# Patient Record
Sex: Male | Born: 1990 | Race: White | Hispanic: No | Marital: Single | Smoking: Former smoker
Health system: Southern US, Community
[De-identification: ages and names within clinical notes are randomized; demographics above are authoritative.]

## PROBLEM LIST (undated history)

## (undated) DIAGNOSIS — J40 Bronchitis, not specified as acute or chronic: Secondary | ICD-10-CM

## (undated) DIAGNOSIS — F419 Anxiety disorder, unspecified: Secondary | ICD-10-CM

## (undated) DIAGNOSIS — T7840XA Allergy, unspecified, initial encounter: Secondary | ICD-10-CM

## (undated) HISTORY — DX: Allergy, unspecified, initial encounter: T78.40XA

## (undated) HISTORY — DX: Anxiety disorder, unspecified: F41.9

---
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
## 2009-03-22 ENCOUNTER — Emergency Department (HOSPITAL_COMMUNITY): Admission: EM | Admit: 2009-03-22 | Discharge: 2009-03-22 | Payer: Self-pay | Admitting: Emergency Medicine

## 2009-11-15 ENCOUNTER — Emergency Department (HOSPITAL_COMMUNITY): Admission: EM | Admit: 2009-11-15 | Discharge: 2009-11-15 | Payer: Self-pay | Admitting: Family Medicine

## 2010-08-21 LAB — POCT I-STAT, CHEM 8
Calcium, Ion: 1.19 mmol/L (ref 1.12–1.32)
Chloride: 105 mEq/L (ref 96–112)
Creatinine, Ser: 1 mg/dL (ref 0.4–1.5)
Glucose, Bld: 91 mg/dL (ref 70–99)
HCT: 51 % (ref 39.0–52.0)
Hemoglobin: 17.3 g/dL — ABNORMAL HIGH (ref 13.0–17.0)
Potassium: 4.1 mEq/L (ref 3.5–5.1)

## 2010-10-04 ENCOUNTER — Emergency Department (HOSPITAL_COMMUNITY)
Admission: EM | Admit: 2010-10-04 | Discharge: 2010-10-04 | Disposition: A | Discharge status: Home / Self Care | Payer: Self-pay | Attending: Emergency Medicine | Admitting: Emergency Medicine

## 2010-10-04 DIAGNOSIS — R05 Cough: Secondary | ICD-10-CM | POA: Insufficient documentation

## 2010-10-04 DIAGNOSIS — R059 Cough, unspecified: Secondary | ICD-10-CM | POA: Insufficient documentation

## 2010-10-04 DIAGNOSIS — J069 Acute upper respiratory infection, unspecified: Secondary | ICD-10-CM | POA: Insufficient documentation

## 2012-03-11 ENCOUNTER — Emergency Department (HOSPITAL_COMMUNITY)
Admission: EM | Admit: 2012-03-11 | Discharge: 2012-03-11 | Disposition: A | Discharge status: Home / Self Care | Payer: Self-pay | Attending: Emergency Medicine | Admitting: Emergency Medicine

## 2012-03-11 ENCOUNTER — Emergency Department (HOSPITAL_COMMUNITY): Payer: Self-pay

## 2012-03-11 ENCOUNTER — Encounter (HOSPITAL_COMMUNITY): Payer: Self-pay | Admitting: Emergency Medicine

## 2012-03-11 DIAGNOSIS — Z87891 Personal history of nicotine dependence: Secondary | ICD-10-CM | POA: Insufficient documentation

## 2012-03-11 DIAGNOSIS — J40 Bronchitis, not specified as acute or chronic: Secondary | ICD-10-CM | POA: Insufficient documentation

## 2012-03-11 HISTORY — DX: Bronchitis, not specified as acute or chronic: J40

## 2012-03-11 MED ORDER — AZITHROMYCIN 250 MG PO TABS: 250.0000 mg | ORAL_TABLET | Freq: Every day | ORAL | Status: DC

## 2012-03-11 MED ORDER — DEXTROMETHORPHAN POLISTIREX 30 MG/5ML PO LQCR: 60.0000 mg | ORAL | Status: DC | PRN

## 2012-03-11 NOTE — ED Provider Notes (Signed)
History     CSN: [id number]  Arrival date & time 03/11/12  0806   First MD Initiated Contact with Patient 03/11/12 0913      Chief Complaint  Patient presents with  . Cough    (Consider location/radiation/quality/duration/timing/severity/associated sxs/prior treatment) HPI Comments: This is a 21 year old male who presents to the emergency department with chief complaint of cough x5 days. He states that the cough came on suddenly. He states that he had 2 episodes of fever of 102F. He states that he has tried Warden/ranger with some relief. He is not coughing up sputum. Denies chest pain, shortness of breath, nausea, vomiting, diarrhea, constipation. Endorses cough, sore throat, runny nose.  The history is provided by the patient. No language interpreter was used.    Past Medical History  Diagnosis Date  . Bronchitis     History reviewed. No pertinent past surgical history.  No family history on file.  History  Substance Use Topics  . Smoking status: Former Smoker    Types: Cigarettes    Quit date: 03/05/2012  . Smokeless tobacco: Never Used  . Alcohol Use: Yes     social      Review of Systems  Constitutional: Negative for fever.  HENT: Positive for rhinorrhea.   Eyes: Negative for visual disturbance.  Respiratory: Positive for cough. Negative for chest tightness and wheezing.   Cardiovascular: Negative for chest pain and palpitations.  Gastrointestinal: Negative for nausea, vomiting, abdominal pain and diarrhea.  Genitourinary: Negative for dysuria.  Musculoskeletal: Negative for back pain.  Skin: Negative for rash.  Neurological: Negative for weakness.  Psychiatric/Behavioral: Negative for agitation.  All other systems reviewed and are negative.    Allergies  Ceclor  Home Medications   Current Outpatient Rx  Name Route Sig Dispense Refill  . DEXTROMETHORPHAN HBR 15 MG/15ML PO LIQD Oral Take 15 mLs by mouth every 6 (six) hours as needed. Cold  symptoms    . DM-DOXYLAMINE-ACETAMINOPHEN 15-6.25-325 MG/15ML PO LIQD Oral Take 15 mLs by mouth every 6 (six) hours as needed. Sleep      BP 134/91  Pulse 61  Temp 97.7 F (36.5 C) (Oral)  Resp 20  SpO2 100%  Physical Exam  Nursing note and vitals reviewed. Constitutional: He is oriented to person, place, and time. He appears well-developed and well-nourished.  HENT:  Head: Normocephalic and atraumatic.  Eyes: Conjunctivae normal and EOM are normal. Pupils are equal, round, and reactive to light.  Neck: Normal range of motion. Neck supple.  Cardiovascular: Normal rate, regular rhythm and normal heart sounds.   Pulmonary/Chest: Effort normal and breath sounds normal.  Abdominal: Soft. Bowel sounds are normal. He exhibits no distension and no mass. There is no tenderness. There is no rebound and no guarding.  Musculoskeletal: Normal range of motion.  Neurological: He is alert and oriented to person, place, and time.  Skin: Skin is warm and dry.  Psychiatric: He has a normal mood and affect. His behavior is normal. Judgment and thought content normal.    ED Course  Procedures (including critical care time)  Labs Reviewed - No data to display Dg Chest 2 View  03/11/2012  *RADIOLOGY REPORT*  Clinical Data: Deep cough for 3 days.  Smoker.  CHEST - 2 VIEW  Comparison: None.  Findings: No infiltrate, congestive heart failure or pneumothorax.  Heart size within normal limits.  IMPRESSION: No acute abnormality.   Original Report Authenticated By: Fuller Canada, M.D.      1.  Bronchitis       MDM  21 year old male with cough. Chest x-ray reveals no acute process. No signs of pneumonia. Patient is afebrile here in the ED. Will discharge the patient with Delsym cough syrup and Z-Pak. Instructed the patient to hold off on filling the a Z-Pak for 2 days. Continue supportive care with fluids, rest, OTC medications. The patient is stable for discharge.        Roxy Horseman,  PA-C 03/11/12 [phone]

## 2012-03-11 NOTE — ED Notes (Signed)
Pt states that he has been having cough and congestion since Saturday. Taking Dayquil and Nyquil but not helping. States his cough is dry, has been having intermittent fevers.  States chest hurts when he coughs

## 2012-03-12 NOTE — ED Provider Notes (Signed)
Medical screening examination/treatment/procedure(s) were performed by non-physician practitioner and as supervising physician I was immediately available for consultation/collaboration.  Jones Skene, M.D.     Jones Skene, MD 03/12/12 1351

## 2012-10-09 ENCOUNTER — Ambulatory Visit: Payer: 59 | Admitting: Family Medicine

## 2012-10-09 VITALS — BP 122/68 | HR 70 | Temp 98.5°F | Resp 16 | Ht 72.0 in | Wt 242.8 lb

## 2012-10-09 DIAGNOSIS — J039 Acute tonsillitis, unspecified: Secondary | ICD-10-CM

## 2012-10-09 DIAGNOSIS — J029 Acute pharyngitis, unspecified: Secondary | ICD-10-CM

## 2012-10-09 MED ORDER — AMOXICILLIN 400 MG/5ML PO SUSR: ORAL | Status: DC

## 2012-10-09 NOTE — Progress Notes (Signed)
[address]   [place], [place]  [address]   [phone]  Subjective:    Patient ID: Ernest Jennings, male    DOB: 1991-04-17, 22 y.o.   MRN: [id number]  HPI This 22 y.o. male presents for evaluation of sore throat.  Onset two nights ago.  No fever/body aches.  +Mild headache today; +fatigue +dizziness.  No chills/sweats.  +ST diffuse B; no pain with talking or breathing; severe pain with swallowing; no drooling or spitting but a lot of drainage.  No rhinorrhea; no nasal congestion; +PND.  No cough.  No nausea, vomiting,diarrhea; + stomach pain after lunch.  Mild rash on lower abdomen.  No medications.  No tick bites in past three weeks.   PCP:  none    Review of Systems  Constitutional: Positive for fatigue. Negative for fever, chills and diaphoresis.  HENT: Positive for sore throat and trouble swallowing. Negative for ear pain, congestion, rhinorrhea, sneezing, mouth sores, voice change and postnasal drip.   Respiratory: Negative for cough and shortness of breath.   Gastrointestinal: Negative for nausea, vomiting and diarrhea.  Skin: Negative for rash.  Neurological: Positive for dizziness, light-headedness and headaches.        Past Medical History  Diagnosis Date  . Bronchitis   . Allergy     History reviewed. No pertinent past surgical history.  Prior to Admission medications   Not on File    Allergies  Allergen Reactions  . Ceclor (Cefaclor) Hives    History   Social History  . Marital Status: Single    Spouse Name: N/A    Number of Children: N/A  . Years of Education: N/A   Occupational History  . Not on file.   Social History Main Topics  . Smoking status: Former Smoker    Types: Cigarettes    Quit date: 03/05/2012  . Smokeless tobacco: Never Used  . Alcohol Use: Yes     Comment: social  . Drug Use: No  . Sexually Active: Yes    Birth Control/ Protection: Condom   Other Topics Concern  . Not on file   Social History Narrative  . No narrative  on file    Family History  Problem Relation Age of Onset  . Cancer Maternal Grandmother   . Heart disease Maternal Grandfather     Objective:   Physical Exam  Nursing note and vitals reviewed. Constitutional: He is oriented to person, place, and time. He appears well-developed and well-nourished. No distress.  HENT:  Head: Normocephalic and atraumatic.  Right Ear: External ear normal.  Left Ear: External ear normal.  Mouth/Throat: Mucous membranes are normal. Oropharyngeal exudate, posterior oropharyngeal edema and posterior oropharyngeal erythema present. No tonsillar abscesses.  B tonsillar hypertrophy; no uvula deviation.  Eyes: Conjunctivae are normal. Pupils are equal, round, and reactive to light.  Neck: Normal range of motion. Neck supple.  Cardiovascular: Normal rate, regular rhythm and normal heart sounds.   Pulmonary/Chest: Effort normal and breath sounds normal. He has no wheezes. He has no rales.  Lymphadenopathy:    He has cervical adenopathy.  Neurological: He is alert and oriented to person, place, and time.  Skin: No rash noted. He is not diaphoretic.  Psychiatric: He has a normal mood and affect. His behavior is normal.    Results for orders placed in visit on 10/09/12  POCT RAPID STREP A (OFFICE)      Result Value Range   Rapid Strep A Screen Negative  Negative  Assessment & Plan:  Sore throat - Plan: POCT rapid strep A  Acute tonsillitis   1.  Tonsillitis/pharyngitis:  New.  Rapid strep negative.  Empirically treat with Amoxicillin tid x 10 days.  Recommend supportive care with Ibuprofen or Tylenol, gargles.  RTC inability to swallow. Also discussed possibility of Mono diagnosis; RTC one week if no improvement to test for mono.    Meds ordered this encounter  Medications  . amoxicillin (AMOXIL) 400 MG/5ML suspension    Sig: 7.16ml po tid x 10 days    Dispense:  230 mL    Refill:  0

## 2012-10-09 NOTE — Patient Instructions (Addendum)
1.  TAKE IBUPROFEN OR TYLENOL AS NEEDED FOR SORE THROAT, SWELLING, FEVER. 2. GARGLE WITH WARM SALT WATER AS NEEDED. 3. RETURN FOR INABILITY TO SWALLOW. 4. RETURN IF PERSISTENT SORE THROAT IN ONE WEEK TO BE TESTED FOR MONO.

## 2012-11-05 ENCOUNTER — Ambulatory Visit: Payer: 59 | Admitting: Emergency Medicine

## 2012-11-05 VITALS — BP 140/72 | HR 100 | Temp 97.7°F | Resp 16 | Ht 72.0 in | Wt 237.0 lb

## 2012-11-05 DIAGNOSIS — J039 Acute tonsillitis, unspecified: Secondary | ICD-10-CM

## 2012-11-05 MED ORDER — PENICILLIN V POTASSIUM 500 MG PO TABS: 500.0000 mg | ORAL_TABLET | Freq: Four times a day (QID) | ORAL | Status: DC

## 2012-11-05 NOTE — Patient Instructions (Addendum)

## 2012-11-05 NOTE — Progress Notes (Signed)
[facility] [address] [place] [address] [phone]  Date:  11/05/2012   Name:  JIMMIE RUETER   DOB:  Aug 06, 1990   MRN:  [id number]  PCP:  No PCP Per Patient    Chief Complaint: Sore Throat   History of Present Illness:  AMOUS CREWE is a 22 y.o. very pleasant male patient who presents with the following:  Seen on 5/8 with tonsillitis.  Treated with amoxicillin.  Completed course of treatment and was asymptomatic.  Now noticed that his tonsils were red, swollen and has an exudate.   No fever or chills. No coryza or cough.  No nausea or vomiting.  No rash or stool change.  No improvement with over the counter medications or other home remedies. Denies other complaint or health concern today.   There are no active problems to display for this patient.   Past Medical History  Diagnosis Date  . Bronchitis   . Allergy     No past surgical history on file.  History  Substance Use Topics  . Smoking status: Former Smoker    Types: Cigarettes    Quit date: 03/05/2012  . Smokeless tobacco: Never Used  . Alcohol Use: Yes     Comment: social    Family History  Problem Relation Age of Onset  . Cancer Maternal Grandmother   . Heart disease Maternal Grandfather     Allergies  Allergen Reactions  . Ceclor (Cefaclor) Hives    Medication list has been reviewed and updated.  Current Outpatient Prescriptions on File Prior to Visit  Medication Sig Dispense Refill  . amoxicillin (AMOXIL) 400 MG/5ML suspension 7.25ml po tid x 10 days  230 mL  0   No current facility-administered medications on file prior to visit.    Review of Systems:  As per HPI, otherwise negative.    Physical Examination: Filed Vitals:   11/05/12 1724  BP: 140/72  Pulse: 100  Temp: 97.7 F (36.5 C)  Resp: 16   Filed Vitals:   11/05/12 1724  Height: 6' (1.829 m)  Weight: 237 lb (107.502 kg)   Body mass index is 32.14 kg/(m^2). Ideal Body Weight: Weight in (lb)  to have BMI = 25: 183.9  GEN: WDWN, NAD, Non-toxic, A & O x 3 HEENT: Atraumatic, Normocephalic. Neck supple. No masses, No LAD.  Tonsils enlarged with exudate. Ears and Nose: No external deformity. CV: RRR, No M/G/R. No JVD. No thrill. No extra heart sounds. PULM: CTA B, no wheezes, crackles, rhonchi. No retractions. No resp. distress. No accessory muscle use. ABD: S, NT, ND, +BS. No rebound. No HSM. EXTR: No c/c/e NEURO Normal gait.  PSYCH: Normally interactive. Conversant. Not depressed or anxious appearing.  Calm demeanor.    Assessment and Plan: Tonsillitis Penicillin vk Follow up as needed   Signed,  Phillips Odor, MD

## 2013-07-09 ENCOUNTER — Other Ambulatory Visit: Payer: Self-pay | Admitting: Occupational Medicine

## 2013-07-09 ENCOUNTER — Ambulatory Visit: Payer: Self-pay

## 2013-07-09 DIAGNOSIS — M25512 Pain in left shoulder: Secondary | ICD-10-CM

## 2015-08-24 ENCOUNTER — Encounter (HOSPITAL_COMMUNITY): Payer: Self-pay | Admitting: Emergency Medicine

## 2015-08-24 ENCOUNTER — Emergency Department (HOSPITAL_COMMUNITY)
Admission: EM | Admit: 2015-08-24 | Discharge: 2015-08-24 | Disposition: A | Discharge status: Home / Self Care | Payer: Self-pay | Attending: Emergency Medicine | Admitting: Emergency Medicine

## 2015-08-24 DIAGNOSIS — R112 Nausea with vomiting, unspecified: Secondary | ICD-10-CM

## 2015-08-24 DIAGNOSIS — B349 Viral infection, unspecified: Secondary | ICD-10-CM | POA: Insufficient documentation

## 2015-08-24 DIAGNOSIS — Z792 Long term (current) use of antibiotics: Secondary | ICD-10-CM | POA: Insufficient documentation

## 2015-08-24 DIAGNOSIS — R197 Diarrhea, unspecified: Secondary | ICD-10-CM

## 2015-08-24 DIAGNOSIS — Z87891 Personal history of nicotine dependence: Secondary | ICD-10-CM | POA: Insufficient documentation

## 2015-08-24 LAB — COMPREHENSIVE METABOLIC PANEL
ALBUMIN: 4.5 g/dL (ref 3.5–5.0)
ALT: 28 U/L (ref 17–63)
ANION GAP: 8 (ref 5–15)
AST: 21 U/L (ref 15–41)
Alkaline Phosphatase: 77 U/L (ref 38–126)
BILIRUBIN TOTAL: 0.4 mg/dL (ref 0.3–1.2)
BUN: 12 mg/dL (ref 6–20)
CHLORIDE: 105 mmol/L (ref 101–111)
CO2: 26 mmol/L (ref 22–32)
Calcium: 9.5 mg/dL (ref 8.9–10.3)
Creatinine, Ser: 0.99 mg/dL (ref 0.61–1.24)
Glucose, Bld: 99 mg/dL (ref 65–99)
POTASSIUM: 4.7 mmol/L (ref 3.5–5.1)
Sodium: 139 mmol/L (ref 135–145)
TOTAL PROTEIN: 7.8 g/dL (ref 6.5–8.1)

## 2015-08-24 LAB — URINALYSIS, ROUTINE W REFLEX MICROSCOPIC
Bilirubin Urine: NEGATIVE
GLUCOSE, UA: NEGATIVE mg/dL
Hgb urine dipstick: NEGATIVE
KETONES UR: NEGATIVE mg/dL
LEUKOCYTES UA: NEGATIVE
NITRITE: NEGATIVE
PH: 6 (ref 5.0–8.0)
PROTEIN: NEGATIVE mg/dL
Specific Gravity, Urine: 1.022 (ref 1.005–1.030)

## 2015-08-24 LAB — CBC
HEMATOCRIT: 48.2 % (ref 39.0–52.0)
Hemoglobin: 16.2 g/dL (ref 13.0–17.0)
MCH: 30.3 pg (ref 26.0–34.0)
MCHC: 33.6 g/dL (ref 30.0–36.0)
MCV: 90.3 fL (ref 78.0–100.0)
PLATELETS: 321 10*3/uL (ref 150–400)
RBC: 5.34 MIL/uL (ref 4.22–5.81)
RDW: 12.2 % (ref 11.5–15.5)
WBC: 9.2 10*3/uL (ref 4.0–10.5)

## 2015-08-24 LAB — LIPASE, BLOOD: Lipase: 35 U/L (ref 11–51)

## 2015-08-24 MED ORDER — KETOROLAC TROMETHAMINE 30 MG/ML IJ SOLN: 30.0000 mg | Freq: Once | INTRAMUSCULAR | Status: DC

## 2015-08-24 MED ORDER — ONDANSETRON 8 MG PO TBDP
8.0000 mg | ORAL_TABLET | Freq: Once | ORAL | Status: AC
  Administered 2015-08-24: 8 mg via ORAL
  Filled 2015-08-24: qty 1

## 2015-08-24 MED ORDER — SODIUM CHLORIDE 0.9 % IV BOLUS (SEPSIS): 1000.0000 mL | Freq: Once | INTRAVENOUS | Status: DC

## 2015-08-24 MED ORDER — ONDANSETRON HCL 4 MG/2ML IJ SOLN: 4.0000 mg | Freq: Once | INTRAMUSCULAR | Status: DC

## 2015-08-24 MED ORDER — ONDANSETRON HCL 4 MG PO TABS: 4.0000 mg | ORAL_TABLET | Freq: Three times a day (TID) | ORAL | Status: DC | PRN

## 2015-08-24 MED ORDER — IBUPROFEN 800 MG PO TABS: 800.0000 mg | ORAL_TABLET | Freq: Three times a day (TID) | ORAL | Status: DC | PRN

## 2015-08-24 MED ORDER — KETOROLAC TROMETHAMINE 60 MG/2ML IM SOLN
60.0000 mg | Freq: Once | INTRAMUSCULAR | Status: AC
  Administered 2015-08-24: 60 mg via INTRAMUSCULAR
  Filled 2015-08-24: qty 2

## 2015-08-24 MED ORDER — LOPERAMIDE HCL 2 MG PO CAPS: 2.0000 mg | ORAL_CAPSULE | Freq: Four times a day (QID) | ORAL | Status: DC | PRN

## 2015-08-24 NOTE — ED Provider Notes (Signed)
CSN: [id number]     Arrival date & time 08/24/15  1006 History   First MD Initiated Contact with Patient 08/24/15 1156     Chief Complaint  Patient presents with  . N/V/D      (Consider location/radiation/quality/duration/timing/severity/associated sxs/prior Treatment) The history is provided by the patient and a significant other.     Pt presents with N/V/D, cough, sore throat x 3 days.  Denies fever, chills, CP, SOB, abdominal pain, urinary symptoms.  No blood in the emesis or diarrhea.  Has had sick contacts.  No medications prior to arrival.  He attempted to go to work today and felt lightheaded, decided to get checked.    Past Medical History  Diagnosis Date  . Bronchitis   . Allergy    History reviewed. No pertinent past surgical history. Family History  Problem Relation Age of Onset  . Cancer Maternal Grandmother   . Heart disease Maternal Grandfather    Social History  Substance Use Topics  . Smoking status: Former Smoker    Types: Cigarettes    Quit date: 03/05/2012  . Smokeless tobacco: Never Used  . Alcohol Use: Yes     Comment: social    Review of Systems  All other systems reviewed and are negative.     Allergies  Ceclor  Home Medications   Prior to Admission medications   Medication Sig Start Date End Date Taking? Authorizing Provider  amoxicillin (AMOXIL) 400 MG/5ML suspension 7.22ml po tid x 10 days 10/09/12   Ethelda Chick, MD  penicillin v potassium (VEETID) 500 MG tablet Take 1 tablet (500 mg total) by mouth 4 (four) times daily. 11/05/12   Carmelina Dane, MD   BP 119/78 mmHg  Pulse 61  Temp(Src) 98.4 F (36.9 C) (Oral)  Resp 14  SpO2 100% Physical Exam  Constitutional: He appears well-developed and well-nourished. No distress.  HENT:  Head: Normocephalic and atraumatic.  Mouth/Throat: Uvula is midline. Posterior oropharyngeal erythema present. No oropharyngeal exudate, posterior oropharyngeal edema or tonsillar abscesses.  Eyes:  Conjunctivae are normal.  Neck: Neck supple.  Cardiovascular: Normal rate and regular rhythm.   Pulmonary/Chest: Effort normal and breath sounds normal. No respiratory distress. He has no wheezes. He has no rales.  Abdominal: Soft. He exhibits no distension and no mass. There is tenderness (mild, diffuse ). There is no rebound and no guarding.  Neurological: He is alert. He exhibits normal muscle tone.  Skin: He is not diaphoretic.  Psychiatric: He has a normal mood and affect. His behavior is normal.  Nursing note and vitals reviewed.   ED Course  Procedures (including critical care time) Labs Review Labs Reviewed  LIPASE, BLOOD  COMPREHENSIVE METABOLIC PANEL  CBC  URINALYSIS, ROUTINE W REFLEX MICROSCOPIC (NOT AT [facility])    Imaging Review No results found. I have personally reviewed and evaluated these images and lab results as part of my medical decision-making.   EKG Interpretation None      MDM   Final diagnoses:  Viral syndrome  Nausea vomiting and diarrhea    Afebrile, nontoxic patient with constellation of symptoms suggestive of viral syndrome.  No concerning findings on exam.  Labs, UA unremarkable.  Pt tolerating PO fluid.  Discharged home with supportive care, PCP follow up.  Discussed result, findings, treatment, and follow up  with patient.  Pt given return precautions.  Pt verbalizes understanding and agrees with plan.        Trixie Dredge, PA-C 08/24/15 1418  Pricilla Loveless,  MD 08/25/15 [phone]

## 2015-08-24 NOTE — Discharge Instructions (Signed)
Read the information below.  Use the prescribed medication as directed.  Please discuss all new medications with your pharmacist.  You may return to the Emergency Department at any time for worsening condition or any new symptoms that concern you.    If you develop high fevers, worsening abdominal pain, uncontrolled vomiting, or are unable to tolerate fluids by mouth, return to the ER for a recheck.  If you develop high fevers that do not resolve with tylenol or ibuprofen, you have difficulty swallowing or breathing, or you are unable to tolerate fluids by mouth, return to the ER for a recheck.     AllstateCommunity Resource Guide Financial Assistance The United Ways 211 is a great source of information about community services available.  Access by dialing 2-1-1 from anywhere in  [place], or by website -  PooledIncome.plwww.nc211.org.   Other Local Resources (Updated 06/2015)  Financial Assistance   Services    Phone Number and Address  [facility]  Low-cost medical care - 1st and 3rd Saturday of every month  Must not qualify for public or private insurance and must have limited income [phone] [address] [place], [place]    [place] Department of Social Services  Child care  Emergency assistance for housing and Kimberly-Clarkutilities  Food stamps  Medicaid [phone] [address] [place], [place] [address]   [facility]  Low-cost medical care for children, communicable diseases, sexually-transmitted diseases, immunizations, maternity care, womens health and family planning [phone] [address] [place], [place] [address]  [facility]   Medication assistance for [facility] [facility] [place] residents  Must meet income requirements [phone] [address] [place], [place].    [facility] Social Services  Child care  Emergency assistance for housing and SYSCOutilities  Food  stamps  Medicaid [phone] [address] [place], [place] [address]  [facility]   Low-cost medical care,   Monday through Friday, 9 am to 6 pm.   Accepts Medicare/Medicaid, and self-pay [phone] [address] [place], [place] [address]  [facility]  Low-cost medical care - Monday through Friday, 8:30 am - 5:30 pm  Accepts Medicaid and self-pay [phone] [address] [place], [place] [address]   [facility]  Primary medical care, including for those with sickle cell disease  Accepts Medicare, Medicaid, insurance and self-pay [phone] [address] [place], [place]  [facility]   Primary medical care  Accepts Medicare, [place], insurance and self-pay [phone] [address] [place], [place] [address]   [facility] Department of Social Services  Child care  Emergency assistance for housing and Kimberly-Clarkutilities  Food stamps  Medicaid [phone] [address] [place], [place] [address]  [facility] Department of Health and CarMaxHuman Services  Child care  Emergency assistance for housing and Kimberly-Clarkutilities  Food stamps  Medicaid [phone] [address] [place], [place] [address]   [facility] Medication Assistance [facility]  Medication assistance for [facility] [place] residents with no insurance only  Must have a primary care doctor [phone] [address] [place], [place]  [facility]   Primary medical care  ValenciaAccepts Medicare, [place]icaid, insurance  [phone] [address] [place], [place]  MedAssist   Medication assistance [phone]  [facility]   Primary medical care  Accepts Medicare, [place], insurance and self-pay [phone] [address]  [place], [place] [address]  [facility]   Primary medical care  Accepts Medicare,  Medicaid, insurance and self-pay [phone] [address] [place], [place] [address]  [facility]  For [place] residents between the ages of 63 and 26 who do not have any form of health insurance, Medicare, [place], or [place] benefits.  Services are provided free of charge to uninsured patients who fall within federal poverty guidelines.    Hours: Tuesdays and Thursdays, 4:15 - 8 pm [phone] [address] [place], [place] [address]  [facility]     Primary medical care  Dental care  Nutritional counseling  Pharmacy  Accepts Medicaid, Medicare, most insurance.  Fees are adjusted based on ability to pay.   [phone] [facility] [address] [place], [place]  [phone] Phineas Real [facility] [address] [place], [place]  [phone] [facility] [place], [place]  [phone]  [facility], [address] [place], [place]  [phone] [facility] [address] [place], [place]  [facility]  Womens health and family planning [phone] [address] [place], [place]  [facility] Department of Social Services  Child care  Emergency assistance for housing and Kimberly[employer]ark  Medicaid [phone] [address], [place], [place] [address]   [facility]    Ages 66 and older  Hours: Mondays and Thursdays, 7:00 am - 9:00 am Patients are seen on a first come, first served basis. [phone], ext. [phone] [place], [place]  [facility] Division of Social Services  Child care  Emergency assistance for housing and [employer]ly[employer]ark  Medicaid [id number] [address] [place], [place] [address]  [facility]  Medication assistance  Rental assistance  Food pantry  Medication assistance  Housing assistance  Emergency food  distribution  Utility assistance [phone] [address] [place], [place]  [phone]  [address] [place], [place] [address] Hours: Tuesdays and Thursdays from 9am - 12 noon by appointment only  [phone] [address] [place], [place] [address]  [facility] - Lanae Boast   Accepts private insurance, [place], and [place].  Payment is based on a sliding scale for those without insurance.  Hours: Mondays, Tuesdays and Thursdays, 8:30 am - 5:30 pm.   [phone] [address] [place], [place]  [facility] - Family Medicine at [facility], [place], and [place].  Payment is based on a sliding scale for those without insurance. [phone] [address] [place], [place]  [facility] - Pediatrics at E. Scientist, research (physical sciences), [employer], and [place].  Payment is based on a sliding scale for those without insurance [phone] [address], [employer], [place]  [facility] - Pediatrics at [employer], [place], and [place].  Payment is based on a sliding scale for those without insurance. [phone] [address] [place], [place]  [facility] - Pediatrics at [facility], [place], and [place].  Payment is based on a sliding scale for those without insurance. [phone], [phone] [place], [place].    [facility]  Maternity care.  Accepts Medicaid and self-pay. [phone] [address] [place], [place]

## 2015-08-24 NOTE — ED Notes (Signed)
Per pt, states n/v/d since Monday-

## 2015-08-24 NOTE — ED Notes (Signed)
Pt was provided water for Fluid Challenge. Pt also has Mt Dew that he states he has been able to keep down.

## 2015-11-24 ENCOUNTER — Ambulatory Visit (INDEPENDENT_AMBULATORY_CARE_PROVIDER_SITE_OTHER): Payer: Managed Care, Other (non HMO) | Admitting: Family Medicine

## 2015-11-24 VITALS — BP 104/80 | HR 73 | Temp 98.3°F | Resp 16 | Ht 72.0 in | Wt 236.0 lb

## 2015-11-24 DIAGNOSIS — L02429 Furuncle of limb, unspecified: Secondary | ICD-10-CM

## 2015-11-24 DIAGNOSIS — L02439 Carbuncle of limb, unspecified: Secondary | ICD-10-CM | POA: Diagnosis not present

## 2015-11-24 MED ORDER — DOXYCYCLINE HYCLATE 100 MG PO CAPS: 100.0000 mg | ORAL_CAPSULE | Freq: Two times a day (BID) | ORAL | Status: DC

## 2015-11-24 NOTE — Patient Instructions (Addendum)
   IF you received an x-ray today, you will receive an invoice from [facility]. Please contact [facility] at [phone] with questions or concerns regarding your invoice.   IF you received labwork today, you will receive an invoice from [facility]. Please contact [facility] at [phone] with questions or concerns regarding your invoice.   Our billing staff will not be able to assist you with questions regarding bills from these companies.  You will be contacted with the lab results as soon as they are available. The fastest way to get your results is to activate your My Chart account. Instructions are located on the last page of this paperwork. If you have not heard from us regarding the results in 2 weeks, please contact this office.     Abscess An abscess is an infected area that contains a collection of pus and debris.It can occur in almost any part of the body. An abscess is also known as a furuncle or boil. CAUSES  An abscess occurs when tissue gets infected. This can occur from blockage of oil or sweat glands, infection of hair follicles, or a minor injury to the skin. As the body tries to fight the infection, pus collects in the area and creates pressure under the skin. This pressure causes pain. People with weakened immune systems have difficulty fighting infections and get certain abscesses more often.  SYMPTOMS Usually an abscess develops on the skin and becomes a painful mass that is red, warm, and tender. If the abscess forms under the skin, you may feel a moveable soft area under the skin. Some abscesses break open (rupture) on their own, but most will continue to get worse without care. The infection can spread deeper into the body and eventually into the bloodstream, causing you to feel ill.  DIAGNOSIS  Your caregiver will take your medical history and perform a physical exam. A sample of fluid may also be taken from the abscess  to determine what is causing your infection. TREATMENT  Your caregiver may prescribe antibiotic medicines to fight the infection. However, taking antibiotics alone usually does not cure an abscess. Your caregiver may need to make a small cut (incision) in the abscess to drain the pus. In some cases, gauze is packed into the abscess to reduce pain and to continue draining the area. HOME CARE INSTRUCTIONS   Only take over-the-counter or prescription medicines for pain, discomfort, or fever as directed by your caregiver.  If you were prescribed antibiotics, take them as directed. Finish them even if you start to feel better.  If gauze is used, follow your caregiver's directions for changing the gauze.  To avoid spreading the infection:  Keep your draining abscess covered with a bandage.  Wash your hands well.  Do not share personal care items, towels, or whirlpools with others.  Avoid skin contact with others.  Keep your skin and clothes clean around the abscess.  Keep all follow-up appointments as directed by your caregiver. SEEK MEDICAL CARE IF:   You have increased pain, swelling, redness, fluid drainage, or bleeding.  You have muscle aches, chills, or a general ill feeling.  You have a fever. MAKE SURE YOU:   Understand these instructions.  Will watch your condition.  Will get help right away if you are not doing well or get worse.   This information is not intended to replace advice given to you by your health care provider. Make sure you discuss any questions you have   with your health care provider.   Document Released: 02/28/2005 Document Revised: 11/20/2011 Document Reviewed: 08/03/2011 Elsevier Interactive Patient Education 2016 Elsevier Inc.  

## 2015-11-26 ENCOUNTER — Ambulatory Visit (INDEPENDENT_AMBULATORY_CARE_PROVIDER_SITE_OTHER): Payer: Managed Care, Other (non HMO) | Admitting: Family Medicine

## 2015-11-26 VITALS — BP 124/80 | HR 78 | Temp 98.5°F | Resp 17 | Ht 72.0 in | Wt 237.0 lb

## 2015-11-26 DIAGNOSIS — F411 Generalized anxiety disorder: Secondary | ICD-10-CM

## 2015-11-26 DIAGNOSIS — L02429 Furuncle of limb, unspecified: Secondary | ICD-10-CM | POA: Diagnosis not present

## 2015-11-26 DIAGNOSIS — L02439 Carbuncle of limb, unspecified: Secondary | ICD-10-CM

## 2015-11-26 NOTE — Progress Notes (Signed)
   Subjective:    Patient ID: Ernest Jennings, male    DOB: 06/21/1990, 25 y.o.   MRN: [id number]  HPI This is a 25 yo male who presents today with pain following I & D of right inner thigh two days ago. He has been taking his antibiotic (doxy) and toleratingf well. He has been doing warm compresses occasionally. The area still remains firm, pain has improved until today when he was working and thighs have been rubbing together. Has taken ibuprofen 400 mg x 1 with little relief.   He reports long standing history of anxiety and states that he and his fiance are trying to quit smoking which is making worse.    Past Medical History  Diagnosis Date  . Bronchitis   . Allergy   . Anxiety    History reviewed. No pertinent past surgical history. Family History  Problem Relation Age of Onset  . Cancer Maternal Grandmother   . Heart disease Maternal Grandfather   . Cancer Mother   . Diabetes Mother   . Heart disease Mother   . Cancer Father   . Heart disease Father   . Hypertension Father    Social History  Substance Use Topics  . Smoking status: Former Smoker    Types: Cigarettes    Quit date: 03/05/2012  . Smokeless tobacco: Never Used  . Alcohol Use: Yes     Comment: social    Review of Systems No fever or chills, little drainage     Objective:   Physical Exam  Constitutional: He is oriented to person, place, and time. He appears well-developed and well-nourished. No distress.  HENT:  Head: Normocephalic and atraumatic.  Cardiovascular: Normal rate.   Pulmonary/Chest: Effort normal.  Musculoskeletal: Normal range of motion.  Neurological: He is alert and oriented to person, place, and time.  Skin: Skin is warm and dry. He is not diaphoretic.  Right upper inner thigh with 1 cm area erythema, central area open, serous drainage. Tender.   Psychiatric: His behavior is normal. Judgment and thought content normal.  Anxious appearing.   Vitals reviewed.     BP 124/80 mmHg   Pulse 78  Temp(Src) 98.5 F (36.9 C) (Oral)  Resp 17  Ht 6' (1.829 m)  Wt 237 lb (107.502 kg)  BMI 32.14 kg/m2  SpO2 99% Wt Readings from Last 3 Encounters:  11/26/15 237 lb (107.502 kg)  11/24/15 236 lb (107.049 kg)  11/05/12 237 lb (107.502 kg)   Dep[facility] screen [facility] 11/26/2015 11/24/2015  Decreased Interest 0 0  Down, Depressed, Hopeless - 0  PHQ - 2 Score 0 0         Assessment & Plan:  1. Carbuncle and furuncle of leg - continue doxycycline, encouraged him to do warm compresses 3-4 times daily - open to air and avoid rubbing thighs together- suggested compression shorts to prevent friction - RTC precautions reviewed - OTC analgesics for pain  2. Anxiety state - discussed importance of self care, regular sleep, exercise, stress management/relief - follow up to discuss further treatment options- counseling, medication   Olean Reeeborah Gessner, FNP-BC  [facility], [facility]  11/28/2015 8:31 PM

## 2015-11-26 NOTE — Patient Instructions (Addendum)
Please do warm compresses at least 4 times a day for the next several days  Finish antibiotic  If you start to notice boil appearing, wash with a salicylic acid wash and apply benzoyl peroxide.      IF you received an x-ray today, you will [facility] an [facility]. Please contact [facility] at [phone] with questions or concerns regarding your invoice.   IF you received labwork today, you will receive an invoice from [facility]. Please contact [facility] at [phone] with questions or concerns regarding your invoice.   Our billing staff will not be able to assist you with questions regarding bills from these companies.  You will be contacted with the lab results as soon as they are available. The fastest way to get your results is to activate your My Chart account. Instructions are located on the last page of this paperwork. If you have not heard from us regarding the results in 2 weeks, please contact this office.

## 2015-11-27 LAB — WOUND CULTURE
Gram Stain: NONE SEEN
Gram Stain: NONE SEEN
ORGANISM ID, BACTERIA: NO GROWTH

## 2015-12-02 NOTE — Progress Notes (Signed)
Subjective:    Patient ID: Ernest Jennings, male    DOB: 10/27/1990, 25 y.o.   MRN: [id number]  11/24/2015  Cyst   HPI This 25 y.o. male presents for evaluation of thigh cyst. Noticed a painful thigh cyst two days ago. No fever/chills/sweats.  History of these cysts several times in the past; has never progressed to this severity.   Review of Systems  Constitutional: Negative for fever, chills, diaphoresis and fatigue.  Skin: Positive for color change and wound.    Past Medical History  Diagnosis Date  . Bronchitis   . Allergy   . Anxiety    History reviewed. No pertinent past surgical history. Allergies  Allergen Reactions  . Ceclor [Cefaclor] Hives  . Seroquel [Quetiapine Fumarate] Hives   Current Outpatient Prescriptions  Medication Sig Dispense Refill  . ibuprofen (ADVIL,MOTRIN) 800 MG tablet Take 1 tablet (800 mg total) by mouth every 8 (eight) hours as needed for mild pain or moderate pain. 15 tablet 0  . doxycycline (VIBRAMYCIN) 100 MG capsule Take 1 capsule (100 mg total) by mouth 2 (two) times daily. 20 capsule 0   No current facility-administered medications for this visit.   Social History   Social History  . Marital Status: Single    Spouse Name: N/A  . Number of Children: N/A  . Years of Education: N/A   Occupational History  .      Warehouse work   Social History Main Topics  . Smoking status: Former Smoker    Types: Cigarettes    Quit date: 03/05/2012  . Smokeless tobacco: Never Used  . Alcohol Use: Yes     Comment: social  . Drug Use: No  . Sexual Activity: Yes    Birth Control/ Protection: Condom   Other Topics Concern  . Not on file   Social History Narrative   Lives: with parents.   Employment: warehouse work.   Family History  Problem Relation Age of Onset  . Cancer Maternal Grandmother   . Heart disease Maternal Grandfather   . Cancer Mother   . Diabetes Mother   . Heart disease Mother   . Cancer Father   . Heart disease  Father   . Hypertension Father        Objective:    BP 104/80 mmHg  Pulse 73  Temp(Src) 98.3 F (36.8 C) (Oral)  Resp 16  Ht 6' (1.829 m)  Wt 236 lb (107.049 kg)  BMI 32.00 kg/m2  SpO2 98% Physical Exam  Constitutional: He is oriented to person, place, and time. He appears well-developed and well-nourished. No distress.  HENT:  Head: Normocephalic and atraumatic.  Eyes: Conjunctivae and EOM are normal. Pupils are equal, round, and reactive to light.  Neck: Normal range of motion. Neck supple. Carotid bruit is not present. No thyromegaly present.  Cardiovascular: Normal rate, regular rhythm, normal heart sounds and intact distal pulses.  Exam reveals no gallop and no friction rub.   No murmur heard. Pulmonary/Chest: Effort normal and breath sounds normal. He has no wheezes. He has no rales.  Lymphadenopathy:    He has no cervical adenopathy.  Neurological: He is alert and oriented to person, place, and time. No cranial nerve deficit.  Skin: Skin is warm and dry. No rash noted. He is not diaphoretic.  R proximal thigh with 2cm x 2 cm area of erythema, fluctuance, tenderness.  No active drainage.  Psychiatric: He has a normal mood and affect. His behavior is normal.  Nursing note and vitals reviewed.  PROCEDURE NOTE: VERBAL CONSENT OBTAINED; 2% LIDOCAINE WITH EPI 1.5CC ADMINISTERED INTO WOUND; 11 BLADE ADVANCED INTO WOUND; BLOODY YELLOW WHITE DRAINAGE EXPRESSED; GOOD HEMOSTASIS OBTAINED; PT TOLERATED PROCEDURE WELL.  BANDAGE APPLIED. WOUND CULTURE OBTAINED.     Assessment & Plan:   1. Carbuncle and furuncle of leg    -New. -s/p simple I&D -rx for Doxycycline provided. -RTC PRN.   Orders Placed This Encounter  Procedures  . WOUND CULTURE    Order Specific Question:  Source    Answer:  R medial thigh   Meds ordered this encounter  Medications  . doxycycline (VIBRAMYCIN) 100 MG capsule    Sig: Take 1 capsule (100 mg total) by mouth 2 (two) times daily.    Dispense:   20 capsule    Refill:  0    No Follow-up on file.    Davontae Prusinski Paulita FujitaMartin Karan Jennings, M.D. [facility]  [facility] [address] [place], [place]  [id number] [phone] phone [phone] fax

## 2017-10-18 ENCOUNTER — Encounter: Payer: Self-pay | Admitting: Family Medicine

## 2017-10-23 ENCOUNTER — Encounter: Payer: Self-pay | Admitting: Family Medicine

## 2019-06-26 ENCOUNTER — Ambulatory Visit: Payer: Self-pay | Attending: Internal Medicine

## 2019-06-26 DIAGNOSIS — Z20822 Contact with and (suspected) exposure to covid-19: Secondary | ICD-10-CM | POA: Insufficient documentation

## 2019-06-27 LAB — NOVEL CORONAVIRUS, NAA: SARS-CoV-2, NAA: NOT DETECTED

## 2019-08-24 ENCOUNTER — Emergency Department (HOSPITAL_COMMUNITY)
Admission: EM | Admit: 2019-08-24 | Discharge: 2019-08-24 | Disposition: A | Discharge status: Home / Self Care | Payer: Self-pay | Attending: Emergency Medicine | Admitting: Emergency Medicine

## 2019-08-24 ENCOUNTER — Emergency Department (HOSPITAL_COMMUNITY): Payer: Self-pay

## 2019-08-24 ENCOUNTER — Other Ambulatory Visit: Payer: Self-pay

## 2019-08-24 ENCOUNTER — Encounter (HOSPITAL_COMMUNITY): Payer: Self-pay | Admitting: Emergency Medicine

## 2019-08-24 DIAGNOSIS — Z87891 Personal history of nicotine dependence: Secondary | ICD-10-CM | POA: Insufficient documentation

## 2019-08-24 DIAGNOSIS — M5431 Sciatica, right side: Secondary | ICD-10-CM | POA: Insufficient documentation

## 2019-08-24 MED ORDER — DEXAMETHASONE SODIUM PHOSPHATE 10 MG/ML IJ SOLN
10.0000 mg | Freq: Once | INTRAMUSCULAR | Status: AC
Start: 2019-08-24 — End: 2019-08-24
  Administered 2019-08-24: 10 mg via INTRAMUSCULAR
  Filled 2019-08-24: qty 1

## 2019-08-24 MED ORDER — PREDNISONE 50 MG PO TABS: 50.0000 mg | ORAL_TABLET | Freq: Every day | ORAL | 0 refills | Status: DC

## 2019-08-24 MED ORDER — KETOROLAC TROMETHAMINE 60 MG/2ML IM SOLN
60.0000 mg | Freq: Once | INTRAMUSCULAR | Status: AC
  Administered 2019-08-24: 60 mg via INTRAMUSCULAR
  Filled 2019-08-24: qty 2

## 2019-08-24 MED ORDER — CYCLOBENZAPRINE HCL 10 MG PO TABS: 10.0000 mg | ORAL_TABLET | Freq: Every day | ORAL | 0 refills | Status: DC

## 2019-08-24 MED ORDER — TRAMADOL HCL 50 MG PO TABS: 50.0000 mg | ORAL_TABLET | Freq: Four times a day (QID) | ORAL | 0 refills | Status: DC | PRN

## 2019-08-24 NOTE — ED Notes (Addendum)
Pt transported to Xray. 

## 2019-08-24 NOTE — ED Triage Notes (Signed)
Patient reports back pain after heavy lifting at work x3 days ago. Reports hx back pain. States pain radiating down right leg. Denies changes in bowel and bladder. Patient also reports persistent cough x1 week. Denies fever and chest pain.

## 2019-08-24 NOTE — ED Provider Notes (Signed)
[facility]-EMERGENCY DEPT Provider Note   CSN: [id number] Arrival date & time: 08/24/19  1624     History Chief Complaint  Patient presents with  . Back Pain  . Cough    Ernest Jennings is a 29 y.o. male.  HPI Patient presents to the emergency department with lower back pain that started while at work 3 days ago.  The patient states that he got his foot caught and tried to turn and he felt a twinge in his back.  He states that is not very painful.  He states that he noticed that the next evening when going to bed but was not very severe.  Patient states last night it was more severe than it had been.  And then states today that it seemed more pronounced while at work.  The patient states that he took some ibuprofen with some relief of his symptoms.  Patient states he had this type of issue previously which was much worse than it is currently.  The patient states that he saw a chiropractor at that time and improved dramatically.  Patient denies numbness, weakness, gait disturbance or syncope.  Patient denies any chest pain or shortness of breath.    Past Medical History:  Diagnosis Date  . Allergy   . Anxiety   . Bronchitis     There are no problems to display for this patient.   History reviewed. No pertinent surgical history.     Family History  Problem Relation Age of Onset  . Cancer Maternal Grandmother   . Heart disease Maternal Grandfather   . Cancer Mother   . Diabetes Mother   . Heart disease Mother   . Cancer Father   . Heart disease Father   . Hypertension Father     Social History   Tobacco Use  . Smoking status: Former Smoker    Types: Cigarettes    Quit date: 03/05/2012    Years since quitting: 7.4  . Smokeless tobacco: Never Used  Substance Use Topics  . Alcohol use: Yes    Comment: social  . Drug use: No    Home Medications Prior to Admission medications   Medication Sig Start Date End Date Taking? Authorizing Provider    ibuprofen (ADVIL) 200 MG tablet Take 200 mg by mouth every 6 (six) hours as needed for moderate pain.   Yes [provider]  doxycycline (VIBRAMYCIN) 100 MG capsule Take 1 capsule (100 mg total) by mouth 2 (two) times daily. Patient not taking: Reported on 08/24/2019 11/24/15   Ethelda Chick, MD  ibuprofen (ADVIL,MOTRIN) 800 MG tablet Take 1 tablet (800 mg total) by mouth every 8 (eight) hours as needed for mild pain or moderate pain. Patient not taking: Reported on 08/24/2019 08/24/15   Trixie Dredge, PA-C    Allergies    Ceclor [cefaclor] and Seroquel [quetiapine fumarate]  Review of Systems   Review of Systems All other systems negative except as documented in the HPI. All pertinent positives and negatives as reviewed in the HPI. Physical Exam Updated Vital Signs BP 134/80   Pulse 61   Temp 98.4 F (36.9 C) (Oral)   Resp 18   Ht 6' (1.829 m)   Wt 111.6 kg   SpO2 96%   BMI 33.36 kg/m   Physical Exam Vitals and nursing note reviewed.  Constitutional:      General: He is not in acute distress.    Appearance: He is well-developed.  HENT:  Head: Normocephalic and atraumatic.  Eyes:     Pupils: Pupils are equal, round, and reactive to light.  Pulmonary:     Effort: Pulmonary effort is normal.  Musculoskeletal:       Back:  Skin:    General: Skin is warm and dry.  Neurological:     Mental Status: He is alert and oriented to person, place, and time.     Sensory: No sensory deficit.     Motor: No weakness.     Coordination: Coordination normal.     Gait: Gait normal.     Deep Tendon Reflexes: Reflexes normal.     ED Results / Procedures / Treatments   Labs (all labs ordered are listed, but only abnormal results are displayed) Labs Reviewed - No data to display  EKG None  Radiology DG Chest 2 View  Result Date: 08/24/2019 CLINICAL DATA:  29 year old male with cough for 1 week and back pain after heavy lifting at work 3 days ago. EXAM: CHEST - 2 VIEW  COMPARISON:  Chest radiographs 03/11/2012. FINDINGS: Lower lung volumes, a especially on the PA view. Mediastinal contours appear to remain normal. Visualized tracheal air column is within normal limits. Crowding of lung markings at the bases but otherwise both lungs remain clear. No pneumothorax or pleural effusion. No osseous abnormality identified. Moderate air in fluid distension of the stomach with otherwise negative visible bowel gas pattern. IMPRESSION: Low lung volumes, otherwise no acute cardiopulmonary abnormality. Electronically Signed   By: Odessa Fleming M.D.   On: 08/24/2019 17:05   DG Lumbar Spine Complete  Result Date: 08/24/2019 CLINICAL DATA:  Lumbar back pain L5 region. History of bulging disc. Marthe Patch a worked 319. EXAM: LUMBAR SPINE - COMPLETE 4+ VIEW COMPARISON:  Reformats from abdominopelvic CT 09/09/2008 FINDINGS: The alignment is maintained. Vertebral body heights are normal. Chronic irregularity about the anterior superior endplate of L3 unchanged from prior exam and suggestive of limbic vertebra. There is no listhesis. The posterior elements are intact. Mild disc space narrowing at L2-L3. Remaining disc spaces are preserved. L5-S1 disc space is normal. No fracture. Sacroiliac joints are congruent. IMPRESSION: 1. No acute abnormality of the lumbar spine. 2. 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 3. Mild disc space narrowing at L2-L3. Electronically Signed   By: Narda Rutherford M.D.   On: 08/24/2019 20:43    Procedures Procedures (including critical care time)  Medications Ordered in ED Medications  ketorolac (TORADOL) injection 60 mg (60 mg Intramuscular Given 08/24/19 2018)  dexamethasone (DECADRON) injection 10 mg (10 mg Intramuscular Given 08/24/19 2017)    ED Course  I have reviewed the triage vital signs and the nursing notes.  Pertinent labs & imaging results that were available during my care of  the patient were reviewed by me and considered in my medical decision making (see chart for details).    MDM Rules/Calculators/A&P                      The patient will be treated for a lumbar back strain.  Patient has no neurological deficits noted on examination.  The patient has been otherwise stable here in the emergency department.  Patient's x-rays do not show any significant abnormality. Final Clinical Impression(s) / ED Diagnoses Final diagnoses:  None    Rx / DC Orders ED Discharge Orders    None       Charlestine Night, PA-C 08/24/19 2140  Carmin Muskrat, MD 08/24/19 2358

## 2019-08-24 NOTE — Discharge Instructions (Signed)
Return here as needed.  Use ice and heat on your lower back.  You have any worsening of condition you will need to be reassessed here.

## 2019-09-08 ENCOUNTER — Other Ambulatory Visit: Payer: Self-pay

## 2019-09-08 ENCOUNTER — Encounter (HOSPITAL_COMMUNITY): Payer: Self-pay | Admitting: *Deleted

## 2019-09-08 ENCOUNTER — Emergency Department (HOSPITAL_COMMUNITY)
Admission: EM | Admit: 2019-09-08 | Discharge: 2019-09-08 | Disposition: A | Discharge status: Home / Self Care | Payer: Self-pay | Attending: Emergency Medicine | Admitting: Emergency Medicine

## 2019-09-08 DIAGNOSIS — Z87891 Personal history of nicotine dependence: Secondary | ICD-10-CM | POA: Insufficient documentation

## 2019-09-08 DIAGNOSIS — Z79899 Other long term (current) drug therapy: Secondary | ICD-10-CM | POA: Insufficient documentation

## 2019-09-08 DIAGNOSIS — J40 Bronchitis, not specified as acute or chronic: Secondary | ICD-10-CM | POA: Insufficient documentation

## 2019-09-08 DIAGNOSIS — R05 Cough: Secondary | ICD-10-CM

## 2019-09-08 DIAGNOSIS — R059 Cough, unspecified: Secondary | ICD-10-CM

## 2019-09-08 MED ORDER — AZITHROMYCIN 250 MG PO TABS: 250.0000 mg | ORAL_TABLET | Freq: Every day | ORAL | 0 refills | Status: AC

## 2019-09-08 MED ORDER — PREDNISONE 20 MG PO TABS
60.0000 mg | ORAL_TABLET | Freq: Once | ORAL | Status: AC
  Administered 2019-09-08: 60 mg via ORAL
  Filled 2019-09-08: qty 3

## 2019-09-08 MED ORDER — ALBUTEROL SULFATE HFA 108 (90 BASE) MCG/ACT IN AERS
2.0000 | INHALATION_SPRAY | Freq: Once | RESPIRATORY_TRACT | Status: AC
  Administered 2019-09-08: 08:00:00 2 via RESPIRATORY_TRACT
  Filled 2019-09-08: qty 6.7

## 2019-09-08 MED ORDER — PREDNISONE 20 MG PO TABS: 40.0000 mg | ORAL_TABLET | Freq: Every day | ORAL | 0 refills | Status: AC

## 2019-09-08 NOTE — ED Provider Notes (Signed)
[facility]-EMERGENCY DEPT Provider Note   CSN: [id number] Arrival date & time: 09/08/19  0703     History Chief Complaint  Patient presents with  . Cough    Ernest Jennings is a 29 y.o. male with history of allergies and bronchitis who presents with a cough. He states that he has had a cough for about a month. He gets bronchitis about twice a year but what is new about this is he has to lie down to catch his breath. He has been getting very SOB with exertion over the past day. The cough is productive at times of bright yellow sputum. He was seen in the ED for cough and back pain a month ago and CXR was negative. He denies smoking cigarettes but vapes. He also is having sinus pressure and post-nasal drip. He has not taken anything for his symptoms. He has a sore throat and chest pain with deep heavy coughing but this is not constant. No COVID contacts.  HPI     Past Medical History:  Diagnosis Date  . Allergy   . Anxiety   . Bronchitis     There are no problems to display for this patient.   No past surgical history on file.     Family History  Problem Relation Age of Onset  . Cancer Maternal Grandmother   . Heart disease Maternal Grandfather   . Cancer Mother   . Diabetes Mother   . Heart disease Mother   . Cancer Father   . Heart disease Father   . Hypertension Father     Social History   Tobacco Use  . Smoking status: Former Smoker    Types: Cigarettes    Quit date: 03/05/2012    Years since quitting: 7.5  . Smokeless tobacco: Never Used  Substance Use Topics  . Alcohol use: Yes    Comment: social  . Drug use: No    Home Medications Prior to Admission medications   Medication Sig Start Date End Date Taking? Authorizing Provider  cyclobenzaprine (FLEXERIL) 10 MG tablet Take 1 tablet (10 mg total) by mouth at bedtime. 08/24/19   Lawyer, Cristal Deer, PA-C  doxycycline (VIBRAMYCIN) 100 MG capsule Take 1 capsule (100 mg total) by mouth 2  (two) times daily. Patient not taking: Reported on 08/24/2019 11/24/15   Ethelda Chick, MD  ibuprofen (ADVIL) 200 MG tablet Take 200 mg by mouth every 6 (six) hours as needed for moderate pain.    [provider]  ibuprofen (ADVIL,MOTRIN) 800 MG tablet Take 1 tablet (800 mg total) by mouth every 8 (eight) hours as needed for mild pain or moderate pain. Patient not taking: Reported on 08/24/2019 08/24/15   Trixie Dredge, PA-C  predniSONE (DELTASONE) 50 MG tablet Take 1 tablet (50 mg total) by mouth daily with breakfast. 08/24/19   Lawyer, Cristal Deer, PA-C  traMADol (ULTRAM) 50 MG tablet Take 1 tablet (50 mg total) by mouth every 6 (six) hours as needed for severe pain. 08/24/19   Lawyer, Cristal Deer, PA-C    Allergies    Ceclor [cefaclor] and Seroquel [quetiapine fumarate]  Review of Systems   Review of Systems  Constitutional: Negative for chills and fever.  HENT: Positive for sinus pressure and sore throat (with coughing). Negative for ear pain.   Respiratory: Positive for cough and shortness of breath. Negative for wheezing.   Cardiovascular: Positive for chest pain (with coughing).  Neurological: Negative for headaches.    Physical Exam Updated Vital Signs  BP 117/80 (BP Location: Left Arm)   Pulse 90   Temp 97.7 F (36.5 C) (Oral)   Resp 20   Ht 6\' 1"  (1.854 m)   Wt 111.6 kg   SpO2 98%   BMI 32.46 kg/m   Physical Exam Vitals and nursing note reviewed.  Constitutional:      General: He is not in acute distress.    Appearance: Normal appearance. He is well-developed. He is not ill-appearing.  HENT:     Head: Normocephalic and atraumatic.  Eyes:     General: Lids are normal. No scleral icterus.       Right eye: No discharge.        Left eye: No discharge.     Conjunctiva/sclera: Conjunctivae normal.     Pupils: Pupils are equal, round, and reactive to light.  Cardiovascular:     Rate and Rhythm: Normal rate and regular rhythm.  Pulmonary:     Effort: Pulmonary  effort is normal. No respiratory distress.     Breath sounds: Normal breath sounds.  Abdominal:     General: There is no distension.     Palpations: Abdomen is soft.     Tenderness: There is no abdominal tenderness.  Musculoskeletal:     Cervical back: Normal range of motion.  Skin:    General: Skin is warm and dry.  Neurological:     Mental Status: He is alert and oriented to person, place, and time.  Psychiatric:        Behavior: Behavior normal.     ED Results / Procedures / Treatments   Labs (all labs ordered are listed, but only abnormal results are displayed) Labs Reviewed - No data to display  EKG None  Radiology No results found.  Procedures Procedures (including critical care time)  Medications Ordered in ED Medications  albuterol (VENTOLIN HFA) 108 (90 Base) MCG/ACT inhaler 2 puff (2 puffs Inhalation Given 09/08/19 0803)  predniSONE (DELTASONE) tablet 60 mg (60 mg Oral Given 09/08/19 0802)    ED Course  I have reviewed the triage vital signs and the nursing notes.  Pertinent labs & imaging results that were available during my care of the patient were reviewed by me and considered in my medical decision making (see chart for details).  29 year old male presents with a cough ongoing for about one month. His vitals are normal. On exam he is alert and oriented. HENT exam is normal. Heart is regular rate and rhythm. Lungs are CTA. Pt states he gets bronchitis 2x yearly but hasn't had the SOB with it before. Will not repeat CXR with normal O2 sats and clear lung sounds. Will do trial of albuterol and ambulate.  Pt ambulated and maintained his O2 sats. Will treat for bronchitis with Z-pack and prednisone. Low suspicion for COVID. Advised return if worsening.  Ernest Jennings was evaluated in Emergency Department on 09/08/2019 for the symptoms described in the history of present illness. He was evaluated in the context of the global COVID-19 pandemic, which necessitated  consideration that the patient might be at risk for infection with the SARS-CoV-2 virus that causes COVID-19. Institutional protocols and algorithms that pertain to the evaluation of patients at risk for COVID-19 are in a state of rapid change based on information released by regulatory bodies including the CDC and federal and state organizations. These policies and algorithms were followed during the patient's care in the ED.  MDM Rules/Calculators/A&P  Final Clinical Impression(s) / ED Diagnoses Final diagnoses:  Cough  Bronchitis    Rx / DC Orders ED Discharge Orders    None       Bethel Born, PA-C 09/08/19 0913    Lorre Nick, MD 09/08/19 [phone]

## 2019-09-08 NOTE — Discharge Instructions (Signed)
Use inhaler as needed for shortness of breath Start Z-pack for sinusitis/bronchitis Start Prednisone for the next 5 days

## 2019-09-08 NOTE — ED Triage Notes (Signed)
Pt states he has had a cough for over a month. Productive yellow secretions, deep congested cough. Has been treated without results.

## 2019-09-08 NOTE — ED Notes (Signed)
Patient ambulated in room .Marland Kitchen o2 sat maintained at 99% RA while ambulating

## 2019-10-18 ENCOUNTER — Encounter (HOSPITAL_COMMUNITY): Payer: Self-pay

## 2019-10-18 ENCOUNTER — Emergency Department (HOSPITAL_COMMUNITY): Payer: Self-pay

## 2019-10-18 ENCOUNTER — Other Ambulatory Visit: Payer: Self-pay

## 2019-10-18 ENCOUNTER — Emergency Department (HOSPITAL_COMMUNITY)
Admission: EM | Admit: 2019-10-18 | Discharge: 2019-10-18 | Disposition: A | Discharge status: Home / Self Care | Payer: Self-pay | Attending: Emergency Medicine | Admitting: Emergency Medicine

## 2019-10-18 DIAGNOSIS — J40 Bronchitis, not specified as acute or chronic: Secondary | ICD-10-CM | POA: Insufficient documentation

## 2019-10-18 DIAGNOSIS — J302 Other seasonal allergic rhinitis: Secondary | ICD-10-CM | POA: Insufficient documentation

## 2019-10-18 DIAGNOSIS — Z87891 Personal history of nicotine dependence: Secondary | ICD-10-CM | POA: Insufficient documentation

## 2019-10-18 MED ORDER — BENZONATATE 100 MG PO CAPS: 100.0000 mg | ORAL_CAPSULE | Freq: Three times a day (TID) | ORAL | 0 refills | Status: DC | PRN

## 2019-10-18 NOTE — Discharge Instructions (Addendum)
Per our discussion, I would recommend that you start taking Claritin or Zyrtec daily for seasonal allergies.  To save some money you can buy the generic versions.  Zyrtec is also known as cetirizine.  Claritin is also known as loratadine.  Try to spend time in a hot shower and a humid environment.  This might help with your breathing and cough.  I am going to prescribe you a short course of Tessalon Perles for your cough.  Please continue taking your prescribed prednisone and azithromycin.  Please stop smoking!  Try a combination of Nicorette gum as well as a nicotine patch.  Studies show this is just as effective as prescription medications.  If your symptoms worsen, please return to the emergency department for reevaluation.

## 2019-10-18 NOTE — ED Provider Notes (Signed)
[facility] DEPT Provider Note   CSN: [id number] Arrival date & time: 10/18/19  1218     History Chief Complaint  Patient presents with  . Cough  . pain in ribs    Ernest Jennings is a 29 y.o. male.  HPI HPI Comments: Ernest Jennings is a 29 y.o. male who presents to the Emergency Department complaining of cough for the past 2 months.  Patient has history of bronchitis and has been evaluated for this multiple times in the past.  He just finished a course of prednisone without relief.  He is currently taking azithromycin.  He states he was using albuterol without relief.  About 4 days ago his cough began worsening.  Is dry most of the day but states it is mildly productive in the mornings.  He feels like he has "fluid in his chest".  He reports associated rhinorrhea and congestion.  He has been taking Flonase without relief.  Mild shortness of breath after bouts of coughing.  Chest pain when coughing.  No fevers, chills, sore throat, abdominal pain, nausea, vomiting, diarrhea, posttussive emesis, urinary changes, syncope.     Past Medical History:  Diagnosis Date  . Allergy   . Anxiety   . Bronchitis     There are no problems to display for this patient.   History reviewed. No pertinent surgical history.     Family History  Problem Relation Age of Onset  . Cancer Maternal Grandmother   . Heart disease Maternal Grandfather   . Cancer Mother   . Diabetes Mother   . Heart disease Mother   . Cancer Father   . Heart disease Father   . Hypertension Father     Social History   Tobacco Use  . Smoking status: Former Smoker    Types: Cigarettes    Quit date: 03/05/2012    Years since quitting: 7.6  . Smokeless tobacco: Never Used  Substance Use Topics  . Alcohol use: Yes    Comment: social  . Drug use: No    Home Medications Prior to Admission medications   Medication Sig Start Date End Date Taking? Authorizing Provider  azithromycin  (ZITHROMAX) 250 MG tablet Take 1 tablet (250 mg total) by mouth daily. Take first 2 tablets together, then 1 every day until finished. 09/08/19   Recardo Evangelist, PA-C  cyclobenzaprine (FLEXERIL) 10 MG tablet Take 1 tablet (10 mg total) by mouth at bedtime. 08/24/19   Lawyer, Harrell Gave, PA-C  ibuprofen (ADVIL) 200 MG tablet Take 200 mg by mouth every 6 (six) hours as needed for moderate pain.    [provider]  predniSONE (DELTASONE) 20 MG tablet Take 2 tablets (40 mg total) by mouth daily. 09/08/19   Recardo Evangelist, PA-C  traMADol (ULTRAM) 50 MG tablet Take 1 tablet (50 mg total) by mouth every 6 (six) hours as needed for severe pain. 08/24/19   Lawyer, Harrell Gave, PA-C    Allergies    Ceclor [cefaclor] and Seroquel [quetiapine fumarate]  Review of Systems   Review of Systems  Constitutional: Negative for chills and fever.  HENT: Positive for congestion and rhinorrhea. Negative for sore throat, trouble swallowing and voice change.   Respiratory: Positive for cough, chest tightness and shortness of breath. Negative for wheezing.   Cardiovascular: Negative for chest pain and leg swelling.  Gastrointestinal: Negative for abdominal pain, diarrhea, nausea and vomiting.  Genitourinary: Negative for dysuria and hematuria.  Neurological: Negative for syncope.  All  other systems reviewed and are negative.  Physical Exam Updated Vital Signs BP 108/83 (BP Location: Left Arm)   Pulse 98   Temp 97.8 F (36.6 C) (Oral)   Resp 18   Ht 6\' 1"  (1.854 m)   Wt 109.8 kg   SpO2 97%   BMI 31.93 kg/m   Physical Exam Vitals and nursing note reviewed.  Constitutional:      General: He is not in acute distress.    Appearance: Normal appearance. He is normal weight. He is not ill-appearing, toxic-appearing or diaphoretic.  HENT:     Head: Normocephalic and atraumatic.     Right Ear: Tympanic membrane, ear canal and external ear normal. There is no impacted cerumen.     Left Ear: Tympanic  membrane, ear canal and external ear normal. There is no impacted cerumen.     Nose: Congestion present. No rhinorrhea.     Comments: Boggy nasal turbinates noted bilaterally    Mouth/Throat:     Mouth: Mucous membranes are moist.     Pharynx: Oropharynx is clear. No oropharyngeal exudate or posterior oropharyngeal erythema.  Eyes:     General: No scleral icterus.       Right eye: No discharge.        Left eye: No discharge.     Extraocular Movements: Extraocular movements intact.     Conjunctiva/sclera: Conjunctivae normal.     Pupils: Pupils are equal, round, and reactive to light.  Cardiovascular:     Rate and Rhythm: Normal rate and regular rhythm.     Pulses: Normal pulses.     Heart sounds: Normal heart sounds. No murmur. No friction rub. No gallop.      Comments: Diffuse anterior chest wall pain with palpation.  No crepitus.  No signs of trauma. Pulmonary:     Effort: Pulmonary effort is normal. No respiratory distress.     Breath sounds: Normal breath sounds. No stridor. No wheezing, rhonchi or rales.     Comments: Lungs clear to auscultation bilaterally.  No wheezing, rales, rhonchi.  Symmetrical rise and fall the chest. Abdominal:     General: Abdomen is flat.     Palpations: Abdomen is soft.     Tenderness: There is no abdominal tenderness.  Musculoskeletal:        General: Normal range of motion.     Cervical back: Normal range of motion. No tenderness.  Skin:    General: Skin is warm and dry.     Capillary Refill: Capillary refill takes less than 2 seconds.  Neurological:     General: No focal deficit present.     Mental Status: He is alert and oriented to person, place, and time.  Psychiatric:        Mood and Affect: Mood normal.        Behavior: Behavior normal.    ED Results / Procedures / Treatments   Labs (all labs ordered are listed, but only abnormal results are displayed) Labs Reviewed - No data to display  EKG None  Radiology DG Chest Portable 1  View  Result Date: 10/18/2019 CLINICAL DATA:  Cough for 2 months, chest wall pain EXAM: PORTABLE CHEST 1 VIEW COMPARISON:  08/24/2019 FINDINGS: Cardiomediastinal contours are stable. Lung volumes slightly diminished. Lungs are clear. No sign of pleural effusion. Visualized skeletal structures are unremarkable. IMPRESSION: No acute cardiopulmonary disease. Electronically Signed   By: 08/26/2019 M.D.   On: 10/18/2019 14:45    Procedures Procedures   Medications  Ordered in ED Medications - No data to display  ED Course  I have reviewed the triage vital signs and the nursing notes.  Pertinent labs & imaging results that were available during my care of the patient were reviewed by me and considered in my medical decision making (see chart for details).    MDM Rules/Calculators/A&P                      2:32 PM patient is a 29 year old male with a long history of bronchitis.  He has been evaluated for this multiple times recently.  He is currently taking azithromycin and prednisone without relief.  His symptoms began to worsen once again 4 days ago.  He quit smoking 1 to 2 months ago but was a 2 pack/day smoker before this.  He is still vaping.  Physical exam is reassuring.  He is oxygenating well.  His lungs are clear to auscultation bilaterally.  Symmetrical rise and fall the chest.  Will obtain a chest x-ray and will reassess.  3:49 PM chest x-ray is negative.  His vital signs are still stable.  He is oxygenating well.  Not tachypneic.  Afebrile.  I discussed his symptoms with him in detail.  I believe that he is likely experiencing accommodation of bronchitis as well as seasonal allergies.  I recommended that he start taking Claritin or Zyrtec daily.  We will prescribe him a short course of Tessalon Perles.  I recommended that he continue taking his prednisone.  Finish his azithromycin.  We discussed smoking cessation at length.  I recommended the patch and gum in combination.  He verbalized  understanding of this.  He understands to return to the emergency department if his symptoms worsen.  His questions were answered and he was amicable at the time of discharge.  His vital signs are stable.  Patient discharged to home/self care.  Condition at discharge: Stable  Note: Portions of this report may have been transcribed using voice recognition software. Every effort was made to ensure accuracy; however, inadvertent computerized transcription errors may be present.    Final Clinical Impression(s) / ED Diagnoses Final diagnoses:  Bronchitis  Seasonal allergies    Rx / DC Orders ED Discharge Orders         Ordered    benzonatate (TESSALON) 100 MG capsule  3 times daily PRN     10/18/19 1553           Placido Sou, PA-C 10/19/19 1939    Lorre Nick, MD 10/20/19 1550

## 2019-10-18 NOTE — ED Triage Notes (Signed)
Patient states he has been coughing x 2 months. Patient was seen at [facility]  2 days ago. Patient had a negative Covid test and CXR was negative according to the patient.  Patient c/o rib cage pain.

## 2019-11-08 ENCOUNTER — Other Ambulatory Visit: Payer: Self-pay

## 2019-11-08 ENCOUNTER — Emergency Department (HOSPITAL_COMMUNITY): Payer: Self-pay

## 2019-11-08 ENCOUNTER — Encounter (HOSPITAL_COMMUNITY): Payer: Self-pay

## 2019-11-08 ENCOUNTER — Emergency Department (HOSPITAL_COMMUNITY)
Admission: EM | Admit: 2019-11-08 | Discharge: 2019-11-09 | Disposition: A | Discharge status: Home / Self Care | Payer: Self-pay | Attending: Emergency Medicine | Admitting: Emergency Medicine

## 2019-11-08 DIAGNOSIS — F333 Major depressive disorder, recurrent, severe with psychotic symptoms: Secondary | ICD-10-CM | POA: Insufficient documentation

## 2019-11-08 DIAGNOSIS — F4323 Adjustment disorder with mixed anxiety and depressed mood: Secondary | ICD-10-CM

## 2019-11-08 DIAGNOSIS — R197 Diarrhea, unspecified: Secondary | ICD-10-CM | POA: Insufficient documentation

## 2019-11-08 DIAGNOSIS — Z87891 Personal history of nicotine dependence: Secondary | ICD-10-CM | POA: Insufficient documentation

## 2019-11-08 DIAGNOSIS — R112 Nausea with vomiting, unspecified: Secondary | ICD-10-CM | POA: Insufficient documentation

## 2019-11-08 DIAGNOSIS — R45851 Suicidal ideations: Secondary | ICD-10-CM | POA: Insufficient documentation

## 2019-11-08 LAB — CBC
HCT: 48.8 % (ref 39.0–52.0)
Hemoglobin: 16.3 g/dL (ref 13.0–17.0)
MCH: 31.2 pg (ref 26.0–34.0)
MCHC: 33.4 g/dL (ref 30.0–36.0)
MCV: 93.3 fL (ref 80.0–100.0)
Platelets: 329 10*3/uL (ref 150–400)
RBC: 5.23 MIL/uL (ref 4.22–5.81)
RDW: 11.9 % (ref 11.5–15.5)
WBC: 12.5 10*3/uL — ABNORMAL HIGH (ref 4.0–10.5)
nRBC: 0 % (ref 0.0–0.2)

## 2019-11-08 LAB — COMPREHENSIVE METABOLIC PANEL
ALT: 48 U/L — ABNORMAL HIGH (ref 0–44)
AST: 28 U/L (ref 15–41)
Albumin: 4.2 g/dL (ref 3.5–5.0)
Alkaline Phosphatase: 85 U/L (ref 38–126)
Anion gap: 11 (ref 5–15)
BUN: 13 mg/dL (ref 6–20)
CO2: 24 mmol/L (ref 22–32)
Calcium: 9.1 mg/dL (ref 8.9–10.3)
Chloride: 106 mmol/L (ref 98–111)
Creatinine, Ser: 1.13 mg/dL (ref 0.61–1.24)
GFR calc Af Amer: >60 mL/min (ref >60)
GFR calc non Af Amer: >60 mL/min (ref >60)
Glucose, Bld: 106 mg/dL — ABNORMAL HIGH (ref 70–99)
Potassium: 4.2 mmol/L (ref 3.5–5.1)
Sodium: 141 mmol/L (ref 135–145)
Total Bilirubin: 0.3 mg/dL (ref 0.3–1.2)
Total Protein: 7.2 g/dL (ref 6.5–8.1)

## 2019-11-08 LAB — LIPASE, BLOOD: Lipase: 63 U/L — ABNORMAL HIGH (ref 11–51)

## 2019-11-08 MED ORDER — SODIUM CHLORIDE (PF) 0.9 % IJ SOLN
INTRAMUSCULAR | Status: AC
  Filled 2019-11-08: qty 50

## 2019-11-08 MED ORDER — MORPHINE SULFATE (PF) 4 MG/ML IV SOLN
4.0000 mg | Freq: Once | INTRAVENOUS | Status: AC
  Administered 2019-11-08: 4 mg via INTRAVENOUS
  Filled 2019-11-08: qty 1

## 2019-11-08 MED ORDER — ONDANSETRON HCL 4 MG/2ML IJ SOLN
4.0000 mg | Freq: Once | INTRAMUSCULAR | Status: AC
  Administered 2019-11-08: 4 mg via INTRAVENOUS
  Filled 2019-11-08: qty 2

## 2019-11-08 MED ORDER — SODIUM CHLORIDE 0.9% FLUSH: 3.0000 mL | Freq: Once | INTRAVENOUS | Status: DC

## 2019-11-08 MED ORDER — SODIUM CHLORIDE 0.9 % IV BOLUS
1000.0000 mL | Freq: Once | INTRAVENOUS | Status: AC
  Administered 2019-11-08: 1000 mL via INTRAVENOUS

## 2019-11-08 MED ORDER — IOHEXOL 300 MG/ML  SOLN
100.0000 mL | Freq: Once | INTRAMUSCULAR | Status: AC | PRN
  Administered 2019-11-09: 100 mL via INTRAVENOUS

## 2019-11-08 NOTE — ED Triage Notes (Signed)
Pt reports lower abdominal pain that started about 2 hours ago. He states that he was coughing, then vomited, and then the pain started. States it radiates to his groin. He denies dysuria. A&ox4. States that he has vomiting several times since. Also endorses diarrhea x 3-4 days.

## 2019-11-08 NOTE — ED Provider Notes (Signed)
[facility] DEPT Provider Note   CSN: [id number] Arrival date & time: 11/08/19  2134     History Chief Complaint  Patient presents with   Abdominal Pain    Ernest Jennings is a 29 y.o. male with a past medical history of anxiety, seasonal allergies presenting to the ED with a chief complaint of right lower quadrant abdominal pain.  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  He had another subsequent episode of emesis while showering.  He has not taken any medications to help with his symptoms.  He denies any suspicious food intake, states that he ate pizza and aside from a restaurant that he has seen in the past.  He has been having nonbloody diarrhea for the past 2 to 3 days.  He denies any urinary symptoms, hematuria, history of kidney stones, chest pain, shortness of breath or sick contacts with similar symptoms.  He denies prior abdominal surgeries or daily alcohol use.  He also endorses suicidal ideation, worsening anxiety and auditory hallucinations that are telling him to harm himself.  States that he has been stressed regarding his work, feeling like he is going to lose his friends.  He denies any specific plan.  He states that he may have tried to hurt himself in the past with a knife but "never gone through with it."  He denies any visual hallucinations, drug use, homicidal ideation. HPI     Past Medical History:  Diagnosis Date   Allergy    Anxiety    Bronchitis     There are no problems to display for this patient.   History reviewed. No pertinent surgical history.     Family History  Problem Relation Age of Onset   Cancer Maternal Grandmother    Heart disease Maternal Grandfather    Cancer Mother    Diabetes Mother    Heart disease Mother    Cancer Father    Heart disease  Father    Hypertension Father     Social History   Tobacco Use   Smoking status: Former Smoker    Types: Cigarettes    Quit date: 03/05/2012    Years since quitting: 7.6   Smokeless tobacco: Never Used  Substance Use Topics   Alcohol use: Yes    Comment: social   Drug use: No    Home Medications Prior to Admission medications   Medication Sig Start Date End Date Taking? Authorizing Provider  azithromycin (ZITHROMAX) 250 MG tablet Take 1 tablet (250 mg total) by mouth daily. Take first 2 tablets together, then 1 every day until finished. 09/08/19   Recardo Evangelist, PA-C  benzonatate (TESSALON) 100 MG capsule Take 1 capsule (100 mg total) by mouth 3 (three) times daily as needed for cough. 10/18/19   Rayna Sexton, PA-C  cyclobenzaprine (FLEXERIL) 10 MG tablet Take 1 tablet (10 mg total) by mouth at bedtime. 08/24/19   Lawyer, Harrell Gave, PA-C  ibuprofen (ADVIL) 200 MG tablet Take 200 mg by mouth every 6 (six) hours as needed for moderate pain.    [provider]  predniSONE (DELTASONE) 20 MG tablet Take 2 tablets (40 mg total) by mouth daily. 09/08/19   Recardo Evangelist, PA-C  traMADol (ULTRAM) 50 MG tablet Take 1 tablet (50 mg total) by mouth every 6 (six) hours as needed for  severe pain. 08/24/19   Lawyer, Cristal Deer, PA-C    Allergies    Ceclor [cefaclor] and Seroquel [quetiapine fumarate]  Review of Systems   Review of Systems  Constitutional: Negative for appetite change, chills and fever.  HENT: Negative for ear pain, rhinorrhea, sneezing and sore throat.   Eyes: Negative for photophobia and visual disturbance.  Respiratory: Negative for cough, chest tightness, shortness of breath and wheezing.   Cardiovascular: Negative for chest pain and palpitations.  Gastrointestinal: Positive for abdominal pain, diarrhea, nausea and vomiting. Negative for blood in stool and constipation.  Genitourinary: Negative for dysuria, hematuria and urgency.  Musculoskeletal:  Negative for myalgias.  Skin: Negative for rash.  Neurological: Negative for dizziness, weakness and light-headedness.  Psychiatric/Behavioral: Positive for dysphoric mood, hallucinations and suicidal ideas. The patient is nervous/anxious.     Physical Exam Updated Vital Signs BP 125/71    Pulse 77    Temp 98.9 F (37.2 C) (Oral)    Resp 16    Ht 6\' 1"  (1.854 m)    Wt 109.8 kg    SpO2 96%    BMI 31.93 kg/m   Physical Exam Vitals and nursing note reviewed.  Constitutional:      General: He is not in acute distress.    Appearance: He is well-developed.  HENT:     Head: Normocephalic and atraumatic.     Nose: Nose normal.  Eyes:     General: No scleral icterus.       Left eye: No discharge.     Conjunctiva/sclera: Conjunctivae normal.  Cardiovascular:     Rate and Rhythm: Normal rate and regular rhythm.     Heart sounds: Normal heart sounds. No murmur. No friction rub. No gallop.   Pulmonary:     Effort: Pulmonary effort is normal. No respiratory distress.     Breath sounds: Normal breath sounds.  Abdominal:     General: Bowel sounds are normal. There is no distension.     Palpations: Abdomen is soft.     Tenderness: There is abdominal tenderness in the right lower quadrant. There is no guarding.  Musculoskeletal:        General: Normal range of motion.     Cervical back: Normal range of motion and neck supple.  Skin:    General: Skin is warm and dry.     Findings: No rash.  Neurological:     Mental Status: He is alert.     Motor: No abnormal muscle tone.     Coordination: Coordination normal.     ED Results / Procedures / Treatments   Labs (all labs ordered are listed, but only abnormal results are displayed) Labs Reviewed  LIPASE, BLOOD - Abnormal; Notable for the following components:      Result Value   Lipase 63 (*)    All other components within normal limits  COMPREHENSIVE METABOLIC PANEL - Abnormal; Notable for the following components:   Glucose, Bld 106  (*)    ALT 48 (*)    All other components within normal limits  CBC - Abnormal; Notable for the following components:   WBC 12.5 (*)    All other components within normal limits  RAPID URINE DRUG SCREEN, HOSP PERFORMED - Abnormal; Notable for the following components:   Opiates POSITIVE (*)    All other components within normal limits  ACETAMINOPHEN LEVEL - Abnormal; Notable for the following components:   Acetaminophen (Tylenol), Serum <10 (*)    All other components within normal  limits  SALICYLATE LEVEL - Abnormal; Notable for the following components:   Salicylate Lvl <7.0 (*)    All other components within normal limits  ETHANOL  URINALYSIS, ROUTINE W REFLEX MICROSCOPIC    EKG None  Radiology CT ABDOMEN PELVIS W CONTRAST  Result Date: 11/09/2019 CLINICAL DATA:  Right lower quadrant abdominal pain. EXAM: CT ABDOMEN AND PELVIS WITH CONTRAST TECHNIQUE: Multidetector CT imaging of the abdomen and pelvis was performed using the standard protocol following bolus administration of intravenous contrast. CONTRAST:  OMNIPAQUE IOHEXOL 300 MG/ML  SOLN COMPARISON:  CT 09/09/2008 FINDINGS: Lower chest: Lung bases are clear. Hepatobiliary: Mild hepatic steatosis without focal lesion. Gallbladder physiologically distended, no calcified stone. No biliary dilatation. Pancreas: Unremarkable. No pancreatic ductal dilatation or surrounding inflammatory changes. Spleen: Normal in size without focal abnormality. Adrenals/Urinary Tract: Normal adrenal glands. No hydronephrosis or perinephric edema. Homogeneous renal enhancement. No evidence of renal calculi. Urinary bladder is physiologically distended without wall thickening. Stomach/Bowel: Stomach is within normal limits. Appendix appears normal, for example series 5 image 88. Terminal ileum is normal. No evidence of bowel wall thickening, distention, or inflammatory changes. Vascular/Lymphatic: No significant vascular findings are present. No enlarged  abdominal or pelvic lymph nodes. Reproductive: Prostate is unremarkable. Other: No free air, free fluid, or intra-abdominal fluid collection. No abdominal wall hernia. Musculoskeletal: There are no acute or suspicious osseous abnormalities. Limbic vertebra at L3, incidental. IMPRESSION: 1. No acute abnormality or explanation for abdominal pain. Normal appendix. 2. Mild hepatic steatosis. Electronically Signed   By: Narda Rutherford M.D.   On: 11/09/2019 00:52    Procedures Procedures (including critical care time)  Medications Ordered in ED Medications  sodium chloride 0.9 % bolus 1,000 mL (0 mLs Intravenous Stopped 11/09/19 0109)  morphine 4 MG/ML injection 4 mg (4 mg Intravenous Given 11/08/19 2330)  ondansetron (ZOFRAN) injection 4 mg (4 mg Intravenous Given 11/08/19 2328)  iohexol (OMNIPAQUE) 300 MG/ML solution 100 mL (100 mLs Intravenous Contrast Given 11/09/19 0013)  dicyclomine (BENTYL) injection 20 mg (20 mg Intramuscular Given 11/09/19 0245)    ED Course  I have reviewed the triage vital signs and the nursing notes.  Pertinent labs & imaging results that were available during my care of the patient were reviewed by me and considered in my medical decision making (see chart for details).    MDM Rules/Calculators/A&P                      29 year old male with past medical history of anxiety presenting to the ED with multiple complaints. Reports right lower quadrant abdominal pain with nausea and vomiting that began today.  Reports diarrhea for the past few days as well.  Lab work including CBC, CMP unremarkable.  Lipase mildly elevated at 63.  CT of the abdomen pelvis shows no evidence of appendicitis or other explanation for his symptoms.  Suspect that her symptoms are viral in nature. He also reports suicidal ideation and auditory hallucinations telling him to hurt himself.  Reports increased stress and anxiety related to his job.  No specific plan in place today.  Medical screening labs are  unremarkable, UDS positive for opiates.  Patient was evaluated by TTS who recommended overnight follow-ups to be evaluated by psychiatry in the morning.  Patient is medically cleared.  All imaging, if done today, including plain films, CT scans, and ultrasounds, independently reviewed by me, and interpretations confirmed via formal radiology reads.  Portions of this note were generated with Dragon dictation  software. Dictation errors may occur despite best attempts at proofreading.  Final Clinical Impression(s) / ED Diagnoses Final diagnoses:  Nausea vomiting and diarrhea  Suicidal ideation    Rx / DC Orders ED Discharge Orders    None       Dietrich Pates, PA-C 11/09/19 0259    Nira Conn, MD 11/09/19 [phone]

## 2019-11-08 NOTE — ED Notes (Signed)
While RN was in the room, pt began to cry and stated he was very stressed at work. Pt endorsed SI with no plan. Reports SI began a couple of days ago. Pt states he has anxiety about his friends leaving him and would like to speak with someone regarding his thoughts. Pt also endorses auditory hallucinations. Pt very anxious in room. MD notified.

## 2019-11-09 ENCOUNTER — Emergency Department (HOSPITAL_COMMUNITY): Payer: Self-pay

## 2019-11-09 ENCOUNTER — Encounter (HOSPITAL_COMMUNITY): Payer: Self-pay | Admitting: Registered Nurse

## 2019-11-09 DIAGNOSIS — F4323 Adjustment disorder with mixed anxiety and depressed mood: Secondary | ICD-10-CM

## 2019-11-09 LAB — URINALYSIS, ROUTINE W REFLEX MICROSCOPIC
Bilirubin Urine: NEGATIVE
Glucose, UA: NEGATIVE mg/dL
Hgb urine dipstick: NEGATIVE
Ketones, ur: NEGATIVE mg/dL
Leukocytes,Ua: NEGATIVE
Nitrite: NEGATIVE
Protein, ur: NEGATIVE mg/dL
Specific Gravity, Urine: >1.046 — ABNORMAL HIGH (ref 1.005–1.030)
pH: 6 (ref 5.0–8.0)

## 2019-11-09 LAB — ACETAMINOPHEN LEVEL: Acetaminophen (Tylenol), Serum: <10 ug/mL — ABNORMAL LOW (ref 10–30)

## 2019-11-09 LAB — RAPID URINE DRUG SCREEN, HOSP PERFORMED
Amphetamines: NOT DETECTED
Barbiturates: NOT DETECTED
Benzodiazepines: NOT DETECTED
Cocaine: NOT DETECTED
Opiates: POSITIVE — AB
Tetrahydrocannabinol: NOT DETECTED

## 2019-11-09 LAB — ETHANOL: Alcohol, Ethyl (B): <10 mg/dL (ref <10)

## 2019-11-09 LAB — SALICYLATE LEVEL: Salicylate Lvl: <7 mg/dL — ABNORMAL LOW (ref 7.0–30.0)

## 2019-11-09 MED ORDER — DICYCLOMINE HCL 10 MG/ML IM SOLN
20.0000 mg | Freq: Once | INTRAMUSCULAR | Status: AC
  Administered 2019-11-09: 20 mg via INTRAMUSCULAR
  Filled 2019-11-09: qty 2

## 2019-11-09 NOTE — [facility] Suicide Risk Assessment (Cosign Needed)
Suicide Risk Assessment  Discharge Assessment   [facility] Discharge Suicide Risk Assessment   Principal Problem: Adjustment disorder with mixed anxiety and depressed mood Discharge Diagnoses: Principal Problem:   Adjustment disorder with mixed anxiety and depressed mood   Total Time spent with patient: 30 minutes  Musculoskeletal: Strength & Muscle Tone: within normal limits Gait & Station: normal Patient leans: N/A  Psychiatric Specialty Exam:   Blood pressure 119/76, pulse 73, temperature 98.9 F (37.2 C), temperature source Oral, resp. rate 18, height 6\' 1"  (1.854 m), weight 109.8 kg, SpO2 99 %.Body mass index is 31.93 kg/m.  General Appearance: Casual  Eye Contact::  Good  Speech:  Clear and Coherent and Normal Rate409  Volume:  Normal  Mood:  "Better"  Affect:  Appropriate and Congruent  Thought Process:  Coherent, Goal Directed and Descriptions of Associations: Intact  Orientation:  Full (Time, Place, and Person)  Thought Content:  WDL Patient denies hallucinations, delusions and paranoia at this time  Suicidal Thoughts:  No  Homicidal Thoughts:  No  Memory:  Immediate;   Good Recent;   Good  Judgement:  Intact  Insight:  Present  Psychomotor Activity:  Normal  Concentration:  Good  Recall:  Good  Fund of Knowledge:Good  Language: Good  Akathisia:  No  Handed:  Right  AIMS (if indicated):     Assets:  Communication Skills Desire for Improvement Housing Resilience Social Support Transportation  Sleep:     Cognition: WNL  ADL's:  Intact   Mental Status Per Nursing Assessment::   On Admission:    [phone], 29 y.o., male patient seen via tele psych by this provider, Dr. 26; and chart reviewed on 11/09/19.  On evaluation Ernest Jennings reports things have been hard since COVID.  Patient states that he is living with his parents who are supportive; also states that he is employed; current job for 1 yr.  Patient denies illicit drug use but UDS positive  opiates and no prescription noted on the PDMP.  Patient states that he would like to be set up with outpatient services mainly therapy.  Patients mother at bedside and states that she feels that the patient is safe and that patient is living with his parents who is in the home with him.  States that patient safe to come home and don't feel that patient would do anything to hurt himself or anyone else.     During evaluation Ernest Jennings is alert/oriented x 4; calm/cooperative; and mood is congruent with affect.  He does not appear to be responding to internal/external stimuli or delusional thoughts.  Patient denies suicidal/self-harm/homicidal ideation, psychosis, and paranoia.  Patient answered question appropriately.     Demographic Factors:  Male and Caucasian  Loss Factors: Financial problems/change in socioeconomic status  Historical Factors: Impulsivity  Risk Reduction Factors:   Sense of responsibility to family, Religious beliefs about death, Employed, Living with another person, especially a relative and Positive social support  Continued Clinical Symptoms:  Adjustment disorder with anxiety and depressed mood  Cognitive Features That Contribute To Risk:  None    Suicide Risk:  Minimal: No identifiable suicidal ideation.  Patients presenting with no risk factors but with morbid ruminations; may be classified as minimal risk based on the severity of the depressive symptoms    Plan Of Care/Follow-up recommendations:  Activity:  As tolerated Diet:  Heart healthy     Discharge Instructions     For your behavioral health needs, you  are advised to follow up with [facility].  You have an intake appointment scheduled for Tuesday, November 17, 2019 at 1:00 pm:       [facility]      [place], [place] [address]      [phone]     Disposition:  Psychiatrically cleared No evidence of imminent risk to self or others  at present.   Patient does not meet criteria for psychiatric inpatient admission. Supportive therapy provided about ongoing stressors. Discussed crisis plan, support from social network, calling 911, coming to the Emergency Department, and calling Suicide Hotline.  Ernest Lightner, NP 11/09/2019, 10:36 AM
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
## 2019-11-09 NOTE — Discharge Instructions (Signed)
For your behavioral health needs, you are advised to follow up with [facility].  You have an intake appointment scheduled for Tuesday, November 17, 2019 at 1:00 pm:       [facility]      [address]      [place], [place] [address]      [phone]

## 2019-11-09 NOTE — BH Assessment (Addendum)
BHH Assessment Progress Note  Per Shuvon Rankin, FNP, this pt does not require psychiatric hospitalization at this time.  Pt is to be discharged from [facility] with referral for [facility].  At 10:41 I called them and spoke to Ossian.  She has scheduled pt for an intake appointment on Tuesday, 11/17/2019 at 13:00 with Richardson Dopp, LCSW.  This has been included in pt's discharge instructions.  Pt's nurse has been notified.  Doylene Canning, MA Triage Specialist [phone]

## 2019-11-17 ENCOUNTER — Encounter (HOSPITAL_COMMUNITY): Payer: Self-pay | Admitting: Licensed Clinical Social Worker

## 2019-11-17 ENCOUNTER — Encounter (HOSPITAL_COMMUNITY): Payer: Self-pay | Admitting: Psychiatry

## 2019-11-17 ENCOUNTER — Ambulatory Visit (INDEPENDENT_AMBULATORY_CARE_PROVIDER_SITE_OTHER): Payer: No Payment, Other | Admitting: Psychiatry

## 2019-11-17 ENCOUNTER — Ambulatory Visit (INDEPENDENT_AMBULATORY_CARE_PROVIDER_SITE_OTHER): Payer: No Payment, Other | Admitting: Licensed Clinical Social Worker

## 2019-11-17 ENCOUNTER — Ambulatory Visit (HOSPITAL_COMMUNITY): Payer: Self-pay | Admitting: Psychiatry

## 2019-11-17 ENCOUNTER — Other Ambulatory Visit: Payer: Self-pay

## 2019-11-17 ENCOUNTER — Ambulatory Visit (HOSPITAL_COMMUNITY)
Admission: EM | Admit: 2019-11-17 | Discharge: 2019-11-17 | Disposition: A | Discharge status: Home / Self Care | Payer: Self-pay

## 2019-11-17 DIAGNOSIS — F4323 Adjustment disorder with mixed anxiety and depressed mood: Secondary | ICD-10-CM

## 2019-11-17 DIAGNOSIS — F29 Unspecified psychosis not due to a substance or known physiological condition: Secondary | ICD-10-CM | POA: Insufficient documentation

## 2019-11-17 DIAGNOSIS — F319 Bipolar disorder, unspecified: Secondary | ICD-10-CM | POA: Insufficient documentation

## 2019-11-17 NOTE — Progress Notes (Signed)
psychiatric Initial Adult Assessment   Patient Identification: Ernest Jennings MRN:  [id number] Date of Evaluation:  11/17/2019 Referral Source: Walk in Chief Complaint:  " I have 2 different voices in my head that are not mine" Visit Diagnosis:    ICD-10-CM   1. Bipolar I disorder (HCC)  F31.9     History of Present Illness: 29 year old male seen today for initial psychiatric evaluation.  Patient was referred to outpatient psychiatry by Elvina Sidle for outpatient services. Today he reports that he is having command hallucinations and increasing anxiety. He notes that he is seeking help today to avoid having a total mental break. He has a psychiatric history of anxiety, adjustment disorder with mixed anxiety/depression, and unspecified psycosis. Patient is currently not prescribed psychotropic medication and he notes that he has never taken medications. He however has a documented allergy to Seroquel.  Today he endorses feelings of distractibility, having elevated mood, flight of ideas, being financially extravagant, having hallucinations, fatigue, and irritable mood.  He reports that he is impulsive and often goes out and buy items that he does not need as well as by things for friends and family.  He also endorses depressive symptoms of insomnia, agitation, fatigue, feelings of guilt, thoughts of death, anxiety, and poor sleep.  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    He reports that life stressors are also causing increased agitation and anxiety.  He reports that he has been losing lots of friends.  He informed provider that he is extremely anxious because he cannot attend the wedding of his best friend.  He notes that he was hanging out with his best friend who is a male and accidentally rubbed her leg.  He notes that his friend perceived his touch as inappropriate and she is  prohibiting him from attending her wedding.  He also reports that he is  going through a divorce and at this time he is residing with his parents who are supportive.  Today he denies /HI/VH.  He endorses passive suicidal thoughts however notes that he has no intent to take his life or harm himself.  He reports that he enjoys working, has a supportive family, and feels that suicide is a selfish act.  He contracts for safety at this time and reports that he would like to try medications instead of being hospitalized.  Patient endorses command hallucinations.  He informed provider  that he has two voices one which is positive and encourages him to seek treatment and other which is negative tells me that he not worth it and to harming self.  He notes that he has heard  voices since he was 8 however notes that the voices are becoming more intrusive.  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  Patient was offered Vraylar 1.5 mg daily to assist with psychiatric condition and is agreeable to try medications.   Patient was seen by outpatient therapist today and he reports that he found therapy effective.  Patient will follow up with provider in 9 days for close monitoring as well as follow-up with outpatient counselor.  No other concerns noted at this time.  Associated Signs/Symptoms: Depression Symptoms:  depressed mood, insomnia, psychomotor agitation, fatigue, feelings of worthlessness/guilt, difficulty concentrating, recurrent thoughts of death, anxiety, loss of energy/fatigue, disturbed sleep, (Hypo) Manic Symptoms:  Distractibility, Elevated Mood, Flight of  Ideas, Financial Extravagance, Hallucinations, Impulsivity, Irritable Mood, Anxiety Symptoms:  Excessive Worry, Psychotic Symptoms:  Hallucinations: Command:  Note he has two voices one which is negative (telling him he is worthless and to hurt him self) and the other which is positive and encourages him to get help PTSD Symptoms: NA  Past  Psychiatric History: Anxiety, Adjustment disorder with mixed anxiety/depression, and unspecified psychosis  Previous Psychotropic Medications: No   Substance Abuse History in the last 12 months:  No.  Consequences of Substance Abuse: NA  Past Medical History:  Past Medical History:  Diagnosis Date  . Allergy   . Anxiety   . Bronchitis    No past surgical history on file.  Family Psychiatric History: Unknown  Family History:  Family History  Problem Relation Age of Onset  . Cancer Maternal Grandmother   . Heart disease Maternal Grandfather   . Cancer Mother   . Diabetes Mother   . Heart disease Mother   . Cancer Father   . Heart disease Father   . Hypertension Father     Social History:   Social History   Socioeconomic History  . Marital status: Single    Spouse name: Not on file  . Number of children: Not on file  . Years of education: Not on file  . Highest education level: Not on file  Occupational History    Employer: [employer]    Comment: Warehouse work  Tobacco Use  . Smoking status: Former Smoker    Packs/day: 2.00    Types: Cigarettes  . Smokeless tobacco: Never Used  Vaping Use  . Vaping Use: Some days  . Substances: Nicotine  Substance and Sexual Activity  . Alcohol use: Yes    Comment: social  . Drug use: No  . Sexual activity: Not Currently  Other Topics Concern  . Not on file  Social History Narrative   Lives: with parents.   Employment: warehouse work.   Social Determinants of Health   Financial Resource Strain: Low Risk   . Difficulty of Paying Living Expenses: Not hard at all  Food Insecurity: No Food Insecurity  . Worried About Programme researcher, broadcasting/film/video in the Last Year: Never true  . Ran Out of Food in the Last Year: Never true  Transportation Needs: No Transportation Needs  . Lack of Transportation (Medical): No  . Lack of Transportation (Non-Medical): No  Physical Activity: Inactive  . Days of Exercise per Week: 0 days  .  Minutes of Exercise per Session: 0 min  Stress: Stress Concern Present  . Feeling of Stress : Very much  Social Connections: Moderately Isolated  . Frequency of Communication with Friends and Family: Once a week  . Frequency of Social Gatherings with Friends and Family: Once a week  . Attends Religious Services: 1 to 4 times per year  . Active Member of Clubs or Organizations: No  . Attends Banker Meetings: 1 to 4 times per year  . Marital Status: Separated    Additional Social History: Patient reports that he resides in [place] with his parents.  He is currently going through divorce and has no children.  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  He denies tobacco, alcohol, or illicit drug use.  Allergies:   Allergies  Allergen Reactions  . Ceclor [Cefaclor] Hives  . Quetiapine Hives  . Seroquel [Quetiapine Fumarate] Hives    Metabolic Disorder  Labs: No results found for: HGBA1C, MPG No results found for: PROLACTIN No results found for: CHOL, TRIG, HDL, CHOLHDL, VLDL, LDLCALC No results found for: TSH  Therapeutic Level Labs: No results found for: LITHIUM No results found for: CBMZ No results found for: VALPROATE  Current Medications: Current Outpatient Medications  Medication Sig Dispense Refill  . azithromycin (ZITHROMAX) 250 MG tablet Take 1 tablet (250 mg total) by mouth daily. Take first 2 tablets together, then 1 every day until finished. (Patient not taking: Reported on 11/09/2019) 6 tablet 0  . benzonatate (TESSALON) 100 MG capsule Take 1 capsule (100 mg total) by mouth 3 (three) times daily as needed for cough. (Patient not taking: Reported on 11/09/2019) 30 capsule 0  . cyclobenzaprine (FLEXERIL) 10 MG tablet Take 1 tablet (10 mg total) by mouth at bedtime. (Patient not taking: Reported on 11/09/2019) 10 tablet 0  . predniSONE (DELTASONE) 20 MG tablet Take 2 tablets (40 mg total) by mouth daily. (Patient not  taking: Reported on 11/09/2019) 8 tablet 0  . traMADol (ULTRAM) 50 MG tablet Take 1 tablet (50 mg total) by mouth every 6 (six) hours as needed for severe pain. (Patient not taking: Reported on 11/09/2019) 15 tablet 0   No current facility-administered medications for this visit.    Musculoskeletal: Strength & Muscle Tone: within normal limits Gait & Station: normal Patient leans: N/A  Psychiatric Specialty Exam: Review of Systems  There were no vitals taken for this visit.There is no height or weight on file to calculate BMI.  General Appearance: Well Groomed  Eye Contact:  Fair  Speech:  Clear and Coherent and Normal Rate  Volume:  Normal  Mood:  Irritable  Affect:  Congruent  Thought Process:  Coherent, Goal Directed and Linear  Orientation:  Full (Time, Place, and Person)  Thought Content:  Hallucinations: Command:  notes he has two voices. One is negative and tells him to hurt himself. The othet encourages him to get help  Suicidal Thoughts:  Yes.  without intent/plan  Homicidal Thoughts:  No  Memory:  Immediate;   Good Recent;   Good Remote;   Good  Judgement:  Good  Insight:  Good  Psychomotor Activity:  Normal  Concentration:  Concentration: Good and Attention Span: Good  Recall:  Good  Fund of Knowledge:Good  Language: Good  Akathisia:  No  Handed:  Right  AIMS (if indicated):  Not done  Assets:  Communication Skills Desire for Improvement Financial Resources/Insurance Housing Social Support  ADL's:  Intact  Cognition: WNL  Sleep:  Poor   Screenings: PHQ2-9     Office Visit from 11/26/2015 in Primary Care at [facility] Visit from 11/24/2015 in Primary Care at [facility] Total Score 0 0      Assessment and Plan: Patient was seen by writer and Dr Evelene Croon. Patient reports that he has been having command hallucinations.  He notes that he has two voices one voice speaks negatively to him and tells him to hurt himself.  The other voice is positive and encourages  him to seek help.  Patient agreeable to try Vraylar 1.5 mg daily improved symptoms.  Patient given samples of Vraylar and is scheduled to follow-up in 9 days for close monitoring. Patient also offered same day follow up with therapist. Patient was provided an excuse note for work tonight.  -Start Vraylar 1.5 mg daily.   Follow-up in 9 days   Shanna Cisco, NP 6/15/20215:07 PM
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
## 2019-11-17 NOTE — ED Notes (Signed)
Patient was assessed for vitals and transferred to out patient and seen by provider. Patient discharged by provider in out patient.

## 2019-11-26 ENCOUNTER — Ambulatory Visit (INDEPENDENT_AMBULATORY_CARE_PROVIDER_SITE_OTHER): Payer: No Payment, Other | Admitting: Psychiatry

## 2019-11-26 ENCOUNTER — Encounter (HOSPITAL_COMMUNITY): Payer: Self-pay | Admitting: Psychiatry

## 2019-11-26 ENCOUNTER — Other Ambulatory Visit: Payer: Self-pay

## 2019-11-26 DIAGNOSIS — F319 Bipolar disorder, unspecified: Secondary | ICD-10-CM

## 2019-11-26 MED ORDER — LAMOTRIGINE 25 MG PO TABS: 25.0000 mg | ORAL_TABLET | Freq: Every day | ORAL | 2 refills | Status: DC

## 2019-11-26 MED ORDER — MIRTAZAPINE 15 MG PO TABS: 15.0000 mg | ORAL_TABLET | Freq: Every day | ORAL | 2 refills | Status: DC

## 2019-11-26 NOTE — Progress Notes (Signed)
BH MD/PA/NP OP Progress Note  11/26/2019 5:42 PM Ernest Jennings  MRN:  [id number]  Chief Complaint: "I stopped the medication you gave me"  HPI: 29 year old male seen today for followup psychiatric evaluation.  Patient was seen by provider two weeks ago presenting with signs of anxiety, depression and mania. He was given a sample of Vrylar 1.5 mg. Today he notes that the medications were ineffective. He states that after taking it for three days he noticed an improvement in hallucinations however on the fitfh day he notes that the voices came back and his anxiety worsened.   Today he is talkative and endorses distractibility, having elevated mood at times and depressed moods other times. He also endorses racing thoughts and being financially extravagant. 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 He also reports that his sleep has been poor over the last few week however notes for the last day or two it has gotten better. He denies SI/HI and endorse auditory hallucinations. He informed Probation officer that his negative voice is no longer present but notes that the positive voice is still there to encourage him. Writer asked patient if he felt Arman Filter was effective decresing his voices and he states it was ineffective.    Patient notes that he went to bar last night and met friends who encouraged him to let stressors of life go.  He reports that their advice was encouraging and is looking forward to spending more time with them, exercising, and enjoying activities that he once did.  He also informed Probation officer that his job hired new drivers and he is looking forward to his new shift as it may help improve his sleep patterns. He will follow up with provider in 1 month and outpatient counselor in 2 weeks for therapy.    Patient is agreeable to start Lamictal 25 mg for 2 weeks and then increase the dose to 50 mg for 2 weeks to help stabilize mood.  He is also agreeable to start Remeron 15  mg nightly to help improve sleep.  No other concerns noted at this time.    Visit Diagnosis:    ICD-10-CM   1. Bipolar I disorder (HCC)  F31.9 mirtazapine (REMERON) 15 MG tablet    lamoTRIgine (LAMICTAL) 25 MG tablet    Past Psychiatric History: Anxiety, Adjustment disorder with mixed anxiety/depression, and unspecified psychosis  Past Medical History:  Past Medical History:  Diagnosis Date  . Allergy   . Anxiety   . Bronchitis    No past surgical history on file.  Family Psychiatric History: Unknown  Family History:  Family History  Problem Relation Age of Onset  . Cancer Maternal Grandmother   . Heart disease Maternal Grandfather   . Cancer Mother   . Diabetes Mother   . Heart disease Mother   . Cancer Father   . Heart disease Father   . Hypertension Father     Social History:  Social History   Socioeconomic History  . Marital status: Single    Spouse name: Not on file  . Number of children: Not on file  . Years of education: Not on file  . 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 Smoking status: Former Smoker    Packs/day: 2.00    Types: Cigarettes  . Smokeless tobacco: Never Used  Vaping Use  . Vaping Use:  Some days  . Substances: Nicotine  Substance and Sexual Activity  . Alcohol use: Yes    Comment: social  . Drug use: No  . Sexual activity: Not Currently  Other Topics Concern  . Not on file  Social History Narrative   Lives: with parents.   Employment: warehouse work.   Social Determinants of Health   Financial Resource Strain: Low Risk   . Difficulty of Paying Living Expenses: Not hard at all  Food Insecurity: No Food Insecurity  . Worried About Charity fundraiser in the Last Year: Never true  . Ran Out of Food in the Last Year: Never true  Transportation Needs: No Transportation Needs  . Lack of Transportation (Medical): No  . Lack of  Transportation (Non-Medical): No  Physical Activity: Inactive  . Days of Exercise per Week: 0 days  . Minutes of Exercise per Session: 0 min  Stress: Stress Concern Present  . Feeling of Stress : Very much  Social Connections: Moderately Isolated  . Frequency of Communication with Friends and Family: Once a week  . Frequency of Social Gatherings with Friends and Family: Once a week  . Attends Religious Services: 1 to 4 times per year  . Active Member of Clubs or Organizations: No  . Attends Archivist Meetings: 1 to 4 times per year  . Marital Status: Separated    Allergies:  Allergies  Allergen Reactions  . Ceclor [Cefaclor] Hives  . Quetiapine Hives  . Seroquel [Quetiapine Fumarate] Hives    Metabolic Disorder Labs: No results found for: HGBA1C, MPG No results found for: PROLACTIN No results found for: CHOL, TRIG, HDL, CHOLHDL, VLDL, LDLCALC No results found for: TSH  Therapeutic Level Labs: No results found for: LITHIUM No results found for: VALPROATE No components found for:  CBMZ  Current Medications: Current Outpatient Medications  Medication Sig Dispense Refill  . azithromycin (ZITHROMAX) 250 MG tablet Take 1 tablet (250 mg total) by mouth daily. Take first 2 tablets together, then 1 every day until finished. (Patient not taking: Reported on 11/09/2019) 6 tablet 0  . benzonatate (TESSALON) 100 MG capsule Take 1 capsule (100 mg total) by mouth 3 (three) times daily as needed for cough. (Patient not taking: Reported on 11/09/2019) 30 capsule 0  . cyclobenzaprine (FLEXERIL) 10 MG tablet Take 1 tablet (10 mg total) by mouth at bedtime. (Patient not taking: Reported on 11/09/2019) 10 tablet 0  . lamoTRIgine (LAMICTAL) 25 MG tablet Take 1 tablet (25 mg total) by mouth daily. 30 tablet 2  . mirtazapine (REMERON) 15 MG tablet Take 1 tablet (15 mg total) by mouth at bedtime. 30 tablet 2  . predniSONE (DELTASONE) 20 MG tablet Take 2 tablets (40 mg total) by mouth daily.  (Patient not taking: Reported on 11/09/2019) 8 tablet 0  . traMADol (ULTRAM) 50 MG tablet Take 1 tablet (50 mg total) by mouth every 6 (six) hours as needed for severe pain. (Patient not taking: Reported on 11/09/2019) 15 tablet 0   No current facility-administered medications for this visit.     Musculoskeletal: Strength & Muscle Tone: within normal limits Gait & Station: normal Patient leans: Right  Psychiatric Specialty Exam: Review of Systems  There were no vitals taken for this visit.There is no height or weight on file to calculate BMI.  General Appearance: Well Groomed  Eye Contact:  Good  Speech:  Clear and Coherent and Pressured  Volume:  Normal  Mood:  Anxious  Affect:  Congruent  Thought Process:  Coherent, Goal Directed and Linear  Orientation:  Full (Time, Place, and Person)  Thought Content: Logical and Hallucinations: Auditory   Suicidal Thoughts:  No  Homicidal Thoughts:  No  Memory:  Immediate;   Good Recent;   Good Remote;   Good  Judgement:  Fair  Insight:  Fair  Psychomotor Activity:  Normal  Concentration:  Concentration: Good and Attention Span: Good  Recall:  Good  Fund of Knowledge: Good  Language: Good  Akathisia:  No  Handed:  Right  AIMS (if indicated): Not done  Assets:  Communication Skills Desire for Improvement Financial Resources/Insurance Housing  ADL's:  Intact  Cognition: WNL  Sleep:  Fair   Screenings: PHQ2-9     Office Visit from 11/26/2015 in Primary Care at [facility] from 11/24/2015 in Primary Care at [facility] Total Score 0 0       Assessment and Plan: Endorses feelings of anxiety, depression, and hypomania. He is agreeable to start Lamictal 25 mg for 2 weeks and then increase the dose to 50 mg for 2 weeks to help stabilize mood.  He is also agreeable to start Remeron 15 mg nightly to help improve sleep. 1. Bipolar I disorder ([place])  Start- mirtazapine (REMERON) 15 MG tablet; Take 1 tablet (15 mg total) by mouth  at bedtime.  Dispense: 30 tablet; Refill: 2 Start- lamoTRIgine (LAMICTAL) 25 MG tablet; Take 1 tablet (25 mg total) by mouth daily.  Dispense: 30 tablet; Refill: 2  Follow up in 1 month   Salley Slaughter, NP 11/26/2019, 5:42 PM

## 2019-12-02 ENCOUNTER — Ambulatory Visit (INDEPENDENT_AMBULATORY_CARE_PROVIDER_SITE_OTHER): Payer: No Payment, Other | Admitting: Licensed Clinical Social Worker

## 2019-12-02 ENCOUNTER — Ambulatory Visit (HOSPITAL_COMMUNITY): Payer: Self-pay | Admitting: Licensed Clinical Social Worker

## 2019-12-02 ENCOUNTER — Other Ambulatory Visit: Payer: Self-pay

## 2019-12-02 DIAGNOSIS — F319 Bipolar disorder, unspecified: Secondary | ICD-10-CM | POA: Diagnosis not present
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
## 2019-12-23 ENCOUNTER — Ambulatory Visit (INDEPENDENT_AMBULATORY_CARE_PROVIDER_SITE_OTHER): Payer: No Payment, Other | Admitting: Psychiatry

## 2019-12-23 ENCOUNTER — Ambulatory Visit (HOSPITAL_COMMUNITY): Payer: Self-pay | Admitting: Psychiatry

## 2019-12-23 ENCOUNTER — Ambulatory Visit (INDEPENDENT_AMBULATORY_CARE_PROVIDER_SITE_OTHER): Payer: No Payment, Other | Admitting: Licensed Clinical Social Worker

## 2019-12-23 ENCOUNTER — Other Ambulatory Visit: Payer: Self-pay

## 2019-12-23 ENCOUNTER — Encounter (HOSPITAL_COMMUNITY): Payer: Self-pay | Admitting: Psychiatry

## 2019-12-23 DIAGNOSIS — F319 Bipolar disorder, unspecified: Secondary | ICD-10-CM

## 2019-12-23 DIAGNOSIS — F411 Generalized anxiety disorder: Secondary | ICD-10-CM | POA: Diagnosis not present

## 2019-12-23 MED ORDER — HYDROXYZINE HCL 10 MG PO TABS: 10.0000 mg | ORAL_TABLET | Freq: Three times a day (TID) | ORAL | 1 refills | Status: DC | PRN

## 2019-12-23 MED ORDER — MIRTAZAPINE 7.5 MG PO TABS: 7.5000 mg | ORAL_TABLET | Freq: Every day | ORAL | 1 refills | Status: DC

## 2019-12-23 MED ORDER — LAMOTRIGINE 25 MG PO TABS: 50.0000 mg | ORAL_TABLET | Freq: Every day | ORAL | 1 refills | Status: DC

## 2019-12-23 NOTE — Progress Notes (Signed)
BH MD/PA/NP OP Progress Note  12/23/2019 4:47 PM Ernest Jennings  MRN:  [id number]  Chief Complaint: "My mood fluctuates from sad to depressed to angry".  HPI: 29 year old male seen today for followup psychiatric evaluation. Patient was seen by provider one month ago presenting with signs of anxiety, depression and mania. He was started on Lamictal 25 mg and he was instructed to increase his dose to 50 mg after two weeks. Patient notes that he forgot to increase his dose and notes that although he has seen improvement in his mood it still fluctuates. Patient is also managed on Remeron 15 mg HS. He notes that the medication was to sedating and informed writer that he cut the does in half.    Today he is is less talkative than he was on prior visits. He notes that he does not have racing thoughts since starting Lamictal.  He also reports he has noticed that since his thoughts have slowed down he has been having more dreams.  Writer asked patient if he has nightmares.  He notes that several of them were nightmares but the majority of them were just random.  At times he notes that he still becomes distracted however he denies resent impulsive behaviors.    Patient notes that he has been more anxious lately. He notes that since he has become more mentally stable he has attempted to make friends. He however notes he loses his confidence and becomes anxious when meeting new people.  He informed Clinical research associate that he is looking for a new job and attempting to do things he use to enjoy like swimming and going to the gym.   He is agreeable to starting hydroxyzine 10 mg 3 times a day to help with symptoms of anxiety. Patient is also agreeable to increase Lamictal 25 mg to 50 mg for 2 weeks and then increase the dose to 75 mg for 2 weeks to help stabilize mood.  He is also agreeable to reduce Remeron 15 mg nightly to 7.5 mg nighty to help improve sleep.  No other concerns noted at this time.  Visit Diagnosis:     ICD-10-CM   1. Bipolar I disorder (HCC)  F31.9 lamoTRIgine (LAMICTAL) 25 MG tablet    mirtazapine (REMERON) 7.5 MG tablet  2. Generalized anxiety disorder  F41.1 hydrOXYzine (ATARAX/VISTARIL) 10 MG tablet    Past Psychiatric History:  Anxiety, Adjustment disorder with mixed anxiety/depression, andunspecified psychosis   Past Medical History:  Past Medical History:  Diagnosis Date  . Allergy   . Anxiety   . Bronchitis    History reviewed. No pertinent surgical history.  Family Psychiatric History: unknown  Family History:  Family History  Problem Relation Age of Onset  . Cancer Maternal Grandmother   . Heart disease Maternal Grandfather   . Cancer Mother   . Diabetes Mother   . Heart disease Mother   . Cancer Father   . Heart disease Father   . Hypertension Father     Social History:  Social History   Socioeconomic History  . Marital status: Single    Spouse name: Not on file  . Number of children: Not on file  . Years of education: Not on file  . Highest education level: Not on file  Occupational History    Employer: [employer]    Comment: Warehouse work  Tobacco Use  . Smoking status: Former Smoker    Packs/day: 2.00    Types: Cigarettes  . Smokeless tobacco:  Never Used  Vaping Use  . Vaping Use: Some days  . Substances: Nicotine  Substance and Sexual Activity  . Alcohol use: Yes    Comment: social  . Drug use: No  . Sexual activity: Not Currently  Other Topics Concern  . Not on file  Social History Narrative   Lives: with parents.   Employment: warehouse work.   Social Determinants of Health   Financial Resource Strain: Low Risk   . Difficulty of Paying Living Expenses: Not hard at all  Food Insecurity: No Food Insecurity  . Worried About Programme researcher, broadcasting/film/video in the Last Year: Never true  . Ran Out of Food in the Last Year: Never true  Transportation Needs: No Transportation Needs  . Lack of Transportation (Medical): No  . Lack of  Transportation (Non-Medical): No  Physical Activity: Inactive  . Days of Exercise per Week: 0 days  . Minutes of Exercise per Session: 0 min  Stress: Stress Concern Present  . Feeling of Stress : Very much  Social Connections: Moderately Isolated  . Frequency of Communication with Friends and Family: Once a week  . Frequency of Social Gatherings with Friends and Family: Once a week  . Attends Religious Services: 1 to 4 times per year  . Active Member of Clubs or Organizations: No  . Attends Banker Meetings: 1 to 4 times per year  . Marital Status: Separated    Allergies:  Allergies  Allergen Reactions  . Ceclor [Cefaclor] Hives  . Quetiapine Hives  . Seroquel [Quetiapine Fumarate] Hives    Metabolic Disorder Labs: No results found for: HGBA1C, MPG No results found for: PROLACTIN No results found for: CHOL, TRIG, HDL, CHOLHDL, VLDL, LDLCALC No results found for: TSH  Therapeutic Level Labs: No results found for: LITHIUM No results found for: VALPROATE No components found for:  CBMZ  Current Medications: Current Outpatient Medications  Medication Sig Dispense Refill  . azithromycin (ZITHROMAX) 250 MG tablet Take 1 tablet (250 mg total) by mouth daily. Take first 2 tablets together, then 1 every day until finished. (Patient not taking: Reported on 11/09/2019) 6 tablet 0  . benzonatate (TESSALON) 100 MG capsule Take 1 capsule (100 mg total) by mouth 3 (three) times daily as needed for cough. (Patient not taking: Reported on 11/09/2019) 30 capsule 0  . cyclobenzaprine (FLEXERIL) 10 MG tablet Take 1 tablet (10 mg total) by mouth at bedtime. (Patient not taking: Reported on 11/09/2019) 10 tablet 0  . hydrOXYzine (ATARAX/VISTARIL) 10 MG tablet Take 1 tablet (10 mg total) by mouth 3 (three) times daily as needed. 90 tablet 1  . lamoTRIgine (LAMICTAL) 25 MG tablet Take 2 tablets (50 mg total) by mouth daily. 45 tablet 1  . mirtazapine (REMERON) 7.5 MG tablet Take 1 tablet (7.5  mg total) by mouth at bedtime. 30 tablet 1  . predniSONE (DELTASONE) 20 MG tablet Take 2 tablets (40 mg total) by mouth daily. (Patient not taking: Reported on 11/09/2019) 8 tablet 0  . traMADol (ULTRAM) 50 MG tablet Take 1 tablet (50 mg total) by mouth every 6 (six) hours as needed for severe pain. (Patient not taking: Reported on 11/09/2019) 15 tablet 0   No current facility-administered medications for this visit.     Musculoskeletal: Strength & Muscle Tone: within normal limits Gait & Station: normal Patient leans: N/A  Psychiatric Specialty Exam: Review of Systems  There were no vitals taken for this visit.There is no height or weight on file to  calculate BMI.  General Appearance: Well Groomed  Eye Contact:  Good  Speech:  Clear and Coherent and Normal Rate  Volume:  Normal  Mood:  Euthymic  Affect:  Congruent  Thought Process:  Coherent, Goal Directed and Linear  Orientation:  Full (Time, Place, and Person)  Thought Content: WDL and Logical   Suicidal Thoughts:  No  Homicidal Thoughts:  No  Memory:  Immediate;   Good Recent;   Good Remote;   Good  Judgement:  Good  Insight:  Good  Psychomotor Activity:  Normal  Concentration:  Concentration: Good and Attention Span: Good  Recall:  Good  Fund of Knowledge: Good  Language: Good  Akathisia:  No  Handed:  Right  AIMS (if indicated): not done  Assets:  Communication Skills Desire for Improvement Financial Resources/Insurance Housing Social Support  ADL's:  Intact  Cognition: WNL  Sleep:  Good   Screenings: GAD-7     Counselor from 12/02/2019 in [facility]  Total GAD-7 Score 10    PHQ2-9     Counselor from 12/02/2019 in [facility] Visit from 11/26/2015 in Primary Care at [facility] Visit from 11/24/2015 in Primary Care at [facility] Total Score 5 0 0  PHQ-9 Total Score 18 -- --       Assessment and Plan: Patient endorses feelings of anxiety  and depression.   He is agreeable to starting hydroxyzine 10 mg 3 times a day to help with symptoms of anxiety.  He is also agreeable to increase Lamictal 25 mg to 50 for 2 weeks and then increase the dose to 75 mg for 2 weeks to help stabilize mood.  He is also agreeable to reduce Remeron 15 mg  To 7.5 mg nightly to help improve sleep.  1. Bipolar I disorder (HCC)  Increased- lamoTRIgine (LAMICTAL) 25 MG tablet; Take 2 tablets (50 mg total) by mouth daily.  Dispense: 45 tablet; Refill: 1 Decreased- mirtazapine (REMERON) 7.5 MG tablet; Take 1 tablet (7.5 mg total) by mouth at bedtime.  Dispense: 30 tablet; Refill: 1  2. Generalized anxiety disorder  Start- hydrOXYzine (ATARAX/VISTARIL) 10 MG tablet; Take 1 tablet (10 mg total) by mouth 3 (three) times daily as needed.  Dispense: 90 tablet; Refill: 1  Follow-up in 1 month Follow-up with therapy Shanna Cisco, NP 12/23/2019, 4:47 PM

## 2019-12-23 NOTE — Progress Notes (Signed)
   THERAPIST PROGRESS NOTE  Session Time: 62  Therapist Response:   Subjective: Pt reports he is having recurrent dreams he states they are "random" but they have increased in frequency since he started to take his medications. He does overall believe his medication have had a positive effect on his mood and symptoms. Ernest Jennings states that he has created a list of "enjoyable activates" 1. Swimming 2. Workout 3. Riding bike 4. Working on cars. He is currently running into time barriers due to work constraints. Currently he is interested in a new job, but wants to be able to drive and continue with his current pay rate  He has ran into financial barriers due to his current mental health status where he had to take 2 weeks off of work and was not receiving a check at that time. Pt reports that he will have to trade in his 2020 ford explorer to get something more affordable as his current payment exceed $700.00.    Objective:  Pt was alert and oriented x 5, dressed casually, with euphoric mood/affect. Ernest Jennings was very engaging throughout the assessment and gave appropriate responses.     Assessment: Pt presents today with a history of Patient was seen by provider two weeks ago presenting with signs of anxiety, depression and mania. He denies any suicidal and homicidal ideations. Currently he endorses symptoms of rapid thoughts, minor depression, isolation, irritability, rapid heartbeat, tired, panic attacks, palpitations, and euphoric mood. He denies delusions but endorses some self-hallucinations.  He still meets criteria for bipolar disorder. Ernest Jennings is taking all his medications on time but is currently cutting his Remeron in all other medications are being taken as directed.   Plan: Follow up with medication mgmt., make list of new jobs that interest pt, do 1 activity weekly swimming/biking/ working out.  Participation Level: Active  Behavioral Response: CasualAlertEuphoric  Type of Therapy: Individual  Therapy  Treatment Goals addressed: Diagnosis: bipolar disorder  Interventions: CBT and Supportive  Summary: Ernest Jennings is a 29 y.o. male who presents with bipolar disorder.   Suicidal/Homicidal: Nowithout intent/plan   Plan: Return again in 3 weeks.  Diagnosis: Axis I: Bipolar, Manic and Bipolar, mixed     Ernest GLASSBURN, LCSW 12/23/2019

## 2020-01-19 ENCOUNTER — Other Ambulatory Visit: Payer: Self-pay

## 2020-01-19 ENCOUNTER — Ambulatory Visit (INDEPENDENT_AMBULATORY_CARE_PROVIDER_SITE_OTHER): Payer: No Payment, Other | Admitting: Licensed Clinical Social Worker

## 2020-01-19 DIAGNOSIS — F319 Bipolar disorder, unspecified: Secondary | ICD-10-CM

## 2020-01-19 DIAGNOSIS — F411 Generalized anxiety disorder: Secondary | ICD-10-CM

## 2020-01-19 NOTE — Progress Notes (Signed)
   THERAPIST PROGRESS NOTE  Session Time: 6  Therapist Response:    Subjective/Objective:  Pt was alert and oriented x 5. He was Dressed casually with good eye contact. Pt present mood was euthymic and he it was congruent with topics discussed.   Pt reports primary stressors is work related. 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 Ernest Jennings has been actively looking at new job opportunities, specifically ones with benefits. Other stressor also include transportation. He currently does not have his own vehicle as pt had to get rid of his new ford explorer due to financial cutbacks. He has taken a loan out with his parents of 8,000 dollars that he has to pay back. Pt does have a car at home, but it is an old [phone] that does not work. His father and him have been trying to make a shed/shop area to work on it, but the shed is still being worked on. Ernest Jennings has made cutbacks on smoking going from 2 packs per day to 1 pack every 3 days.   Assessment/Plan: Pt endorses symptoms of irritability, trouble falling asleep, isolation, rapid thoughts, sometimes pt cannot sleep 1 x per week, and dissociation for up to 20 minutes.  Currently Ernest Jennings does meet criteria for Bipolar 1 disorder currently mixed. He has reported relief since starting medications. Plan for LCSW and pt will be to increase exercise to 2 times per week either biking or gym. Pt will also prioritize jobs that he has researched. Long term goal will be to gain a hobby like riding a motorcycle and taking classes to obtain proper licensure.    Participation Level: Active  Behavioral Response: CasualAlertEuphoric  Type of Therapy: Individual Therapy  Treatment Goals addressed: Diagnosis: bipolar 1 disorder   Interventions: CBT and Supportive  Summary: Ernest Jennings is a 29 y.o. male who presents with bipolar 1 disorder.   Suicidal/Homicidal: Nowithout  intent/plan  Plan: Return again in 02/02/20.  Diagnosis: Axis I: Bipolar, mixed    Ernest DELAHOUSSAYE, LCSW 01/19/2020

## 2020-02-01 ENCOUNTER — Encounter (HOSPITAL_COMMUNITY): Payer: No Payment, Other | Admitting: Psychiatry

## 2020-02-02 ENCOUNTER — Ambulatory Visit (INDEPENDENT_AMBULATORY_CARE_PROVIDER_SITE_OTHER): Payer: No Payment, Other | Admitting: Licensed Clinical Social Worker

## 2020-02-02 ENCOUNTER — Other Ambulatory Visit: Payer: Self-pay

## 2020-02-02 DIAGNOSIS — F3130 Bipolar disorder, current episode depressed, mild or moderate severity, unspecified: Secondary | ICD-10-CM | POA: Diagnosis not present

## 2020-02-02 NOTE — Progress Notes (Signed)
   THERAPIST PROGRESS NOTE  Session Time: 70  Therapist Response:    Subjective/objective: Pt was alert and oriented x 5. 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 He engaged well throughout therapy session as evidence by note below. Pt present with a depressed/anxious mood.   Pt reports going through a lot of depression. He reports being very irritable stating he will get angry at the smallest things and regrets his reaction 20 minutes later. Pt reports that he has not been sleeping throughout the night usually getting around 3 to 4 hours then waking for 1 to 2 hours before falling back asleep. Currently primary stressor is work since they were short staffed until yesterday, currently there is someone new training with Madelaine Bhat. [place] reports this gives him further anxiety as he does not enjoy training other people.   Laquon reports that he has been exercising 1 to 2 times weekly. Reporting that he uses bicycle 1 day per week for 1 to 2 hours and then also doing pushups and sit ups on Wednesdays. Pt reports a goal of increasing this moving forward once his work schedule gets better. He reports that he just a 2007 dodge pickup truck from his current employer for $2200.00. Previous plan was to fix up an old Jeep but pt has not had the time and needs something that will run now. The recently purchased Charmaine Downs does need some work but pt reports it is a lot less than the old Clarita Crane that is currently sitting at his house.      Assessment/plan: Pt endorses symptoms of depression and anxiety. Reporting tension, worry, sadness, isolation, and lack of motivation. Pt currently meets criteria for bipolar 1 most recent episode of depression. Pt reports taking all medications as prescribed and does notice an adverse effect on his medication if he drinks 2 to 3 beers after work then takes his medications. As evidence by an increase in dreams. Plan moving forward using journal to utilize when feeling  irritable to write down his thoughts rather than verbalizing them. Continue to utilize exercise 2 times per week with the goal of 3 to 4.   Participation Level: Active  Behavioral Response: CasualAlertAnxious  Type of Therapy: Individual Therapy  Treatment Goals addressed: Anxiety  Interventions: CBT and Supportive  Summary: JABARRI STEFANELLI is a 29 y.o. male who presents with Bipolar disorder.   Suicidal/Homicidal: Nowithout intent/plan  Plan: Return again in 2 weeks.     [name], LCSW 02/02/2020

## 2020-02-05 ENCOUNTER — Other Ambulatory Visit: Payer: Self-pay

## 2020-02-05 ENCOUNTER — Telehealth (HOSPITAL_COMMUNITY): Payer: No Payment, Other | Admitting: Psychiatry

## 2020-02-16 ENCOUNTER — Other Ambulatory Visit: Payer: Self-pay

## 2020-02-16 ENCOUNTER — Ambulatory Visit (INDEPENDENT_AMBULATORY_CARE_PROVIDER_SITE_OTHER): Payer: No Payment, Other | Admitting: Licensed Clinical Social Worker

## 2020-02-16 DIAGNOSIS — F411 Generalized anxiety disorder: Secondary | ICD-10-CM

## 2020-02-16 DIAGNOSIS — F319 Bipolar disorder, unspecified: Secondary | ICD-10-CM | POA: Diagnosis not present

## 2020-02-16 NOTE — Progress Notes (Signed)
   THERAPIST PROGRESS NOTE  Session Time: 26  Therapist Response:    Subjective/Objective:  Pt was alert and oriented x 5. He was dressed casually but groom well. He engaged well throughout therapy f/u session. He presented today with Euphoric & euthymic mood/affect.   Normal states that he is feeling "very good" today. He states that he has a date tonight and this is his first one in a few months. This is with an old friend from church that just moved back into town. Pt reports that he wants to take things slow and easy to start. He does not want to push away something good. Ernest Jennings reports that his friend that he is going out with is just getting out of a relationship and know that this will cause some stress and tension for her.   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 Pt after this was very irritable, anxious, & tense. Pt wanted to quit his job on the spot. He feels that he never gets a break, and the company depends on Ernest Jennings too much. LCSW used some deep breathing exercises with pt to calm him down. After the exercises were complete. LCSW asked if a comprise could be met with his work. Ernest Jennings suggested he could work all day today, so he didn't have to be on call tonight. LCSW agreed with this plan as well, pt stated he would try.   Pt reports that he has not exercised this week as he should be. But he has been helping his dad out more with the shed they have been working on as evidence by pictures showed. Pt also reports that he has been working with the [facility] to get a title for the car he bought from the tow truck company, but this cane takes up to 3 months to process as they must ask for a new title.    Assessment/plan:  Pt endorses a decrease in symptoms for worthlessness, sadness, and insomnia. Pt still reports anxiety for tension, worry, and irritability. He continues to meet criteria for GAD. He  has been taking all medication as prescribed. Plan moving forward exercise 3 to 4 time weekly on bike, help mother or father 3 x weekly, and continue to journal frustration at least 3 x per week. Treatment plan was updated to progressing, pt will move down in appointment frequency to monthly instead of biweekly.  Participation Level: Active  Behavioral Response: Casual and Fairly GroomedAlertEuphoric and Euthymic  Type of Therapy: Individual Therapy  Treatment Goals addressed: Anxiety and Diagnosis: bipolar disorder   Interventions: CBT, Solution Focused and Supportive  Summary: Ernest Jennings is a 29 y.o. male who presents with bipolar and GAD.   Suicidal/Homicidal: Nowithout intent/plan  Plan: Return again in 2 weeks.    Dory Horn, LCSW 02/16/2020

## 2020-02-27 ENCOUNTER — Emergency Department (HOSPITAL_COMMUNITY)
Admission: EM | Admit: 2020-02-27 | Discharge: 2020-02-27 | Disposition: A | Discharge status: Home / Self Care | Payer: Self-pay | Attending: Emergency Medicine | Admitting: Emergency Medicine

## 2020-02-27 ENCOUNTER — Encounter (HOSPITAL_COMMUNITY): Payer: Self-pay | Admitting: Emergency Medicine

## 2020-02-27 ENCOUNTER — Emergency Department (HOSPITAL_COMMUNITY): Payer: Self-pay

## 2020-02-27 ENCOUNTER — Other Ambulatory Visit: Payer: Self-pay

## 2020-02-27 DIAGNOSIS — M79604 Pain in right leg: Secondary | ICD-10-CM | POA: Insufficient documentation

## 2020-02-27 DIAGNOSIS — Y9289 Other specified places as the place of occurrence of the external cause: Secondary | ICD-10-CM | POA: Insufficient documentation

## 2020-02-27 DIAGNOSIS — Z87891 Personal history of nicotine dependence: Secondary | ICD-10-CM | POA: Insufficient documentation

## 2020-02-27 DIAGNOSIS — W19XXXA Unspecified fall, initial encounter: Secondary | ICD-10-CM

## 2020-02-27 DIAGNOSIS — Y9389 Activity, other specified: Secondary | ICD-10-CM | POA: Insufficient documentation

## 2020-02-27 MED ORDER — KETOROLAC TROMETHAMINE 60 MG/2ML IM SOLN
60.0000 mg | Freq: Once | INTRAMUSCULAR | Status: AC
  Administered 2020-02-27: 60 mg via INTRAMUSCULAR
  Filled 2020-02-27: qty 2

## 2020-02-27 MED ORDER — MELOXICAM 7.5 MG PO TABS
15.0000 mg | ORAL_TABLET | Freq: Every day | ORAL | 0 refills | Status: DC
Start: 2020-02-27 — End: 2020-02-27

## 2020-02-27 MED ORDER — ACETAMINOPHEN 500 MG PO TABS
1000.0000 mg | ORAL_TABLET | Freq: Once | ORAL | Status: AC
  Administered 2020-02-27: 1000 mg via ORAL
  Filled 2020-02-27: qty 2

## 2020-02-27 MED ORDER — MELOXICAM 7.5 MG PO TABS
15.0000 mg | ORAL_TABLET | Freq: Every day | ORAL | 0 refills | Status: AC
Start: 2020-02-27 — End: ?

## 2020-02-27 NOTE — ED Provider Notes (Signed)
[facility]-EMERGENCY DEPT Provider Note   CSN: [id number] Arrival date & time: 02/27/20  0133     History Chief Complaint  Patient presents with  . Leg Pain    Ernest Jennings is a 29 y.o. male.  The history is provided by the patient and medical records.  Leg Pain   29 y.o. M here with right leg pain.  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  No head injury or LOC.  States he has pain throughout his entire right leg, mostly thigh and ankle though.  He is ambulatory but with pain.  Denies back pain, numbness, weakness, or tingling of leg.  No bowel or bladder incontinence.  No meds PTA.  Past Medical History:  Diagnosis Date  . Allergy   . Anxiety   . Bronchitis     Patient Active Problem List   Diagnosis Date Noted  . Generalized anxiety disorder 12/23/2019  . Psychosis (HCC)  11/17/2019  . Bipolar I disorder (HCC) 11/17/2019  . Adjustment disorder with mixed anxiety and depressed mood 11/09/2019    History reviewed. No pertinent surgical history.     Family History  Problem Relation Age of Onset  . Cancer Maternal Grandmother   . Heart disease Maternal Grandfather   . Cancer Mother   . Diabetes Mother   . Heart disease Mother   . Cancer Father   . Heart disease Father   . Hypertension Father     Social History   Tobacco Use  . Smoking status: Former Smoker    Packs/day: 2.00    Types: Cigarettes  . Smokeless tobacco: Never Used  Vaping Use  . Vaping Use: Some days  . Substances: Nicotine  Substance Use Topics  . Alcohol use: Yes    Comment: social  . Drug use: No    Home Medications Prior to Admission medications   Medication Sig Start Date End Date Taking? Authorizing Provider  azithromycin (ZITHROMAX) 250 MG tablet Take 1 tablet (250 mg total) by mouth daily. Take first 2 tablets together, then 1 every day until  finished. Patient not taking: Reported on 11/09/2019 09/08/19   Bethel Born, PA-C  benzonatate (TESSALON) 100 MG capsule Take 1 capsule (100 mg total) by mouth 3 (three) times daily as needed for cough. Patient not taking: Reported on 11/09/2019 10/18/19   Placido Sou, PA-C  cyclobenzaprine (FLEXERIL) 10 MG tablet Take 1 tablet (10 mg total) by mouth at bedtime. Patient not taking: Reported on 11/09/2019 08/24/19   Charlestine Night, PA-C  hydrOXYzine (ATARAX/VISTARIL) 10 MG tablet Take 1 tablet (10 mg total) by mouth 3 (three) times daily as needed. 12/23/19   Shanna Cisco, NP  lamoTRIgine (LAMICTAL) 25 MG tablet Take 2 tablets (50 mg total) by mouth daily. 12/23/19   Shanna Cisco, NP  mirtazapine (REMERON) 7.5 MG tablet Take 1 tablet (7.5 mg total) by mouth at bedtime. 12/23/19   Shanna Cisco, NP  predniSONE (DELTASONE) 20 MG tablet Take 2 tablets (40 mg total) by mouth daily. Patient not taking: Reported on 11/09/2019 09/08/19   Bethel Born, PA-C  traMADol (ULTRAM) 50 MG tablet Take 1 tablet (50 mg total) by mouth every 6 (six) hours as needed for severe pain. Patient not taking: Reported on 11/09/2019 08/24/19   Charlestine Night, PA-C    Allergies    Ceclor [  cefaclor], Quetiapine, and Seroquel [quetiapine fumarate]  Review of Systems   Review of Systems  Musculoskeletal: Positive for arthralgias.  All other systems reviewed and are negative.   Physical Exam Updated Vital Signs BP 121/85 (BP Location: Right Arm)   Pulse 90   Temp 98.1 F (36.7 C) (Oral)   Resp (!) 22   Ht 6' (1.829 m)   Wt 111.6 kg   SpO2 99%   BMI 33.36 kg/m   Physical Exam Vitals and nursing note reviewed.  Constitutional:      Appearance: He is well-developed.  HENT:     Head: Normocephalic and atraumatic.  Eyes:     Conjunctiva/sclera: Conjunctivae normal.     Pupils: Pupils are equal, round, and reactive to light.  Cardiovascular:     Rate and Rhythm: Normal rate and  regular rhythm.     Heart sounds: Normal heart sounds.  Pulmonary:     Effort: Pulmonary effort is normal.     Breath sounds: Normal breath sounds.  Abdominal:     General: Bowel sounds are normal.     Palpations: Abdomen is soft.  Musculoskeletal:        General: Normal range of motion.     Cervical back: Normal range of motion.     Comments: Pelvis stable, non-tender, no leg shortening No tenderness of lumbar spine, no deformity Abrasions noted to right lateral thigh, some early bruising present without deformity or hematoma noted, locally tender Tenderness along right lateral knee and ankle without swelling/deformity present Leg is NVI  Skin:    General: Skin is warm and dry.  Neurological:     Mental Status: He is alert and oriented to person, place, and time.     ED Results / Procedures / Treatments   Labs (all labs ordered are listed, but only abnormal results are displayed) Labs Reviewed - No data to display  EKG None  Radiology DG Ankle Complete Right  Result Date: 02/27/2020 CLINICAL DATA:  Initial evaluation for acute trauma, fall. EXAM: RIGHT ANKLE - COMPLETE 3+ VIEW COMPARISON:  None. FINDINGS: There is no evidence of fracture, dislocation, or joint effusion. There is no evidence of arthropathy or other focal bone abnormality. Soft tissues are unremarkable. IMPRESSION: Negative. Electronically Signed   By: Rise Mu M.D.   On: 02/27/2020 03:12   DG Knee Complete 4 Views Right  Result Date: 02/27/2020 CLINICAL DATA:  Initial evaluation for acute trauma, fall. EXAM: RIGHT KNEE - COMPLETE 4+ VIEW COMPARISON:  None. FINDINGS: No evidence of fracture, dislocation, or joint effusion. No evidence of arthropathy or other focal bone abnormality. Soft tissues are unremarkable. IMPRESSION: Negative. Electronically Signed   By: Rise Mu M.D.   On: 02/27/2020 03:10   DG Femur Min 2 Views Right  Result Date: 02/27/2020 CLINICAL DATA:  Initial evaluation  for acute trauma, fall. EXAM: RIGHT FEMUR 2 VIEWS COMPARISON:  None. FINDINGS: There is no evidence of fracture or other focal bone lesions. Soft tissues are unremarkable. IMPRESSION: Negative. Electronically Signed   By: Rise Mu M.D.   On: 02/27/2020 03:08    Procedures Procedures (including critical care time)  Medications Ordered in ED Medications  ketorolac (TORADOL) injection 60 mg (60 mg Intramuscular Given 02/27/20 0253)  acetaminophen (TYLENOL) tablet 1,000 mg (1,000 mg Oral Given 02/27/20 0253)    ED Course  I have reviewed the triage vital signs and the nursing notes.  Pertinent labs & imaging results that were available during my care of the  patient were reviewed by me and considered in my medical decision making (see chart for details).    MDM Rules/Calculators/A&P  29 y.o. M here with right leg pain after falling off step of his tow truck.  No head injury or LOC.  Pain throughout right leg, worse thigh, knee, and ankle.  No deformities noted on exam.  Some abrasions and early bruising to right lateral thigh.  No appreciable hematoma.  Pelvis stable, non-tender, no leg shortening, leg is NVI.  X-rays negative.  Plan to d/c home with symptomatic care.  Work note given.  Return here for any new/acute changes.  Final Clinical Impression(s) / ED Diagnoses Final diagnoses:  Right leg pain  Fall, initial encounter    Rx / DC Orders ED Discharge Orders         Ordered    meloxicam (MOBIC) 7.5 MG tablet  Daily,   Status:  Discontinued        02/27/20 0350    meloxicam (MOBIC) 7.5 MG tablet  Daily        02/27/20 0351           Garlon Hatchet, PA-C 02/27/20 0355    Sabas Sous, MD 02/27/20 [phone]

## 2020-02-27 NOTE — ED Notes (Signed)
Pt currently in xray

## 2020-02-27 NOTE — ED Triage Notes (Signed)
Patient complaining of right leg pain from hip to ankle. Patient was at work on a big truck and missed a step. Patient states that he has been hurting for over 3 hours. Patient did this at work.

## 2020-02-27 NOTE — Discharge Instructions (Signed)
X-rays were negative.  Can ice and elevate leg to help with any pain/swelling. Take the prescribed medication as directed. Follow-up with your primary care doctor. Return to the ED for new or worsening symptoms.

## 2020-03-01 ENCOUNTER — Ambulatory Visit (INDEPENDENT_AMBULATORY_CARE_PROVIDER_SITE_OTHER): Payer: No Payment, Other | Admitting: Licensed Clinical Social Worker

## 2020-03-01 ENCOUNTER — Other Ambulatory Visit: Payer: Self-pay

## 2020-03-01 DIAGNOSIS — F319 Bipolar disorder, unspecified: Secondary | ICD-10-CM | POA: Diagnosis not present

## 2020-03-01 DIAGNOSIS — F411 Generalized anxiety disorder: Secondary | ICD-10-CM

## 2020-03-01 NOTE — Progress Notes (Signed)
   THERAPIST PROGRESS NOTE  Session Time: 70  Therapist Response:     Subjective/objective:  Pt was alert and oriented x 5. He was dressed casually and engaged well throughout therapy session as evidence by note below. He presented today with irritable mood with depressed affect due to problems with his boss at work.   Primary stressor for pt has been work. He reports that due to staffing problems pt has had to increase the amount of work he is doing along with the frequency. Before pt was able to schedule an appointment for therapy of medication provider and take the whole day off. Now they want him to come in for a few hours go to his scheduled appointments and come back. Ernest Jennings reports that he was close to quitting on multiple occasions in the past two weeks since last therapy session. He really has been thinking of a career change one that provides him with medical benefits. LCSW asked pt to write down possible career changes and to rank them for next time. Pt states if he does make the career change, he will not have much support providing an example of his father always thinking the worst of every job he does. 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 He has a friend that is active [employer] and wants to talk to him for further information.    Assessment/plan: Pt endorses symptoms for depression for irritability, sadness, worthlessness, and insomnia (to little sleep). Currently pt meets criteria for bipolar depression. Plan moving forward to explore career path changes. Pt to write down different careers he could go into and rank them. Ernest Jennings is also to get back to exercising two times per week as he has declined in the past two weeks. He will f/u with LCSW in two weeks.   Participation Level: Active  Behavioral Response: CasualAlertAnxious and Irritable  Type of Therapy: Individual Therapy  Treatment Goals addressed: Anxiety  Interventions: CBT and Supportive  Summary: Ernest Jennings is a 29 y.o. male who presents with bipolar disorder.   Suicidal/Homicidal: Nowithout intent/plan  Plan: Return again in 2 weeks.      [name], LCSW 03/01/2020

## 2020-03-16 ENCOUNTER — Other Ambulatory Visit: Payer: Self-pay

## 2020-03-16 ENCOUNTER — Ambulatory Visit (INDEPENDENT_AMBULATORY_CARE_PROVIDER_SITE_OTHER): Payer: No Payment, Other | Admitting: Licensed Clinical Social Worker

## 2020-03-16 DIAGNOSIS — F411 Generalized anxiety disorder: Secondary | ICD-10-CM

## 2020-03-16 DIAGNOSIS — F319 Bipolar disorder, unspecified: Secondary | ICD-10-CM

## 2020-03-16 NOTE — Progress Notes (Signed)
   THERAPIST PROGRESS NOTE  Session Time: 69  Therapist Response:    Subjective/Objective: Pt was alert and oriented x 5. He was dressed casually and fairly groomed. He presented today with a euthymic mood/affect. Kern engaged well throughout session with good eye contact and was cooperative.   Pt reports things have been headed in the right direction. Pt has decreased his PHQ-9 and GAD -7. PHQ-9 score was a 7 and GAD-7 is a 5. Pt did have a GAD-7 score of 14 when he first starts and 9 last month. Zenas reports that work and personal life are continuing to improve he has met two girls in the past 6 weeks and has hung out with both. This has improved pt mood as evidence by decrease anxiety and depression scores. Work has also been improving. Casper reports that his company has not been making him work as many hours as he once was. There has been a significant decrease in nighttime call made to [employer] for work. This has helped improve his sleep from 2 to 3 hours per night to 5 to 6 hours. He still reports he does not stay asleep and can have trouble falling back asleep in the middle of the night. Raider states "Honestly I am loving myself more and more everyday"   Pt continue to smoke cigarettes about 2 to 3 packs per week. This is a decrease from 5 packs when he first started. He also has been exercising about 3 x per week 2 x daily on a stationary bike. Pt has also been doing more chores around the house as well.     Assessment/Plan: Pt endorses symptoms for anxiety and depression for fatigue, tension, worry, and minor sadness. He does meet criteria for bipolar 1 currently mixed. Jarl has been taking all medication as directed. Plan moving forward continue exercising 3 x per week and he will also attempt to hang out with 1 friend per week for the next 4 weeks. He will decrease from biweekly to every 3 weeks, and he was agreeable to this plan.    Participation Level: Active  Behavioral Response: Casual and  Fairly GroomedAlertEuthymic  Type of Therapy: Individual Therapy  Treatment Goals addressed: Diagnosis: bipolar depression   Interventions: CBT and Supportive  Summary: LUKKA BLACK is a 29 y.o. male who presents with bipolar 1 disorder currently depressed.   Suicidal/Homicidal: Nowithout intent/plan  Plan: Return again in 4 weeks.    Dory Horn, LCSW 03/16/2020

## 2020-03-29 ENCOUNTER — Encounter (HOSPITAL_COMMUNITY): Payer: Self-pay | Admitting: Psychiatry

## 2020-03-29 ENCOUNTER — Other Ambulatory Visit: Payer: Self-pay

## 2020-03-29 ENCOUNTER — Ambulatory Visit (INDEPENDENT_AMBULATORY_CARE_PROVIDER_SITE_OTHER): Payer: No Payment, Other | Admitting: Psychiatry

## 2020-03-29 DIAGNOSIS — F411 Generalized anxiety disorder: Secondary | ICD-10-CM | POA: Diagnosis not present

## 2020-03-29 DIAGNOSIS — F319 Bipolar disorder, unspecified: Secondary | ICD-10-CM

## 2020-03-29 MED ORDER — MIRTAZAPINE 7.5 MG PO TABS: 7.5000 mg | ORAL_TABLET | Freq: Every day | ORAL | 2 refills | Status: DC

## 2020-03-29 MED ORDER — LAMOTRIGINE 25 MG PO TABS: 75.0000 mg | ORAL_TABLET | Freq: Every day | ORAL | 1 refills | Status: DC

## 2020-03-29 MED ORDER — HYDROXYZINE HCL 10 MG PO TABS: 10.0000 mg | ORAL_TABLET | Freq: Three times a day (TID) | ORAL | 1 refills | Status: DC | PRN

## 2020-03-29 NOTE — Progress Notes (Signed)
BH MD/PA/NP OP Progress Note  03/29/2020 4:41 PM Ernest Jennings  MRN:  [id number]  Chief Complaint: "My mood still fluctuates but my anxiety is better"  Chief Complaint    Follow-up; Medication Management      HPI: 29 year old male seen today for followup psychiatric evaluation.  He has a psychiatric history of generalized anxiety, Adjustment disorder, and Bipolar 1. He is currently managed on Lamictal 50 mg,Remeron 7.5 mg, and hydroxyzine 10 mg three times daily. He notes his medications as somewhat effective in managing his psychiatric conditions however notes that his mood continues to fluctuated.   Today he is well groomed, pleasant, cooperative, engaged in conversation, and maintained eye contact. Patient talkative during exam but also appropriately listened and responded to provider. Patient notes that his depression and has improved since his last visit. Patient noted that he has been more motivated to go to work because his depression has improved. He informed Clinical research associate that he wants to now work on his confidence when meeting new people.   Patient informed provider that his mood continues to fluctuate and endorses symptoms of hypomania such as distractibility and racing thoughts.He denies recent impulsive behaviors. He informed provider that his racing thoughts are due to his anxiety surrounding a male he wants to date. He notes that he dated her in high school and messed things up because of his drug habit. He noted that they recently reconnected and he reports that he wants there relationship to work this time around. He denies SI/HI/VAH or paranoia. He endorses adequate sleep and appetite.   He is agreeable to increase Lamictal 50 mg to 75 mg for 2 weeks and then increase the dose to 100mg  for 2 weeks to help stabilize mood.  He will continue all other medications as prescribed and follow up with outpatient therapist for counseling. No other concerns noted at this time.  Visit  Diagnosis:    ICD-10-CM   1. Generalized anxiety disorder  F41.1 hydrOXYzine (ATARAX/VISTARIL) 10 MG tablet  2. Bipolar I disorder (HCC)  F31.9 lamoTRIgine (LAMICTAL) 25 MG tablet    mirtazapine (REMERON) 7.5 MG tablet    Past Psychiatric History:  Anxiety, Adjustment disorder with mixed anxiety/depression, andunspecified psychosis   Past Medical History:  Past Medical History:  Diagnosis Date  . Allergy   . Anxiety   . Bronchitis    No past surgical history on file.  Family Psychiatric History: Maternal cousin Bipolar disorder  Family History:  Family History  Problem Relation Age of Onset  . Cancer Maternal Grandmother   . Heart disease Maternal Grandfather   . Cancer Mother   . Diabetes Mother   . Heart disease Mother   . Cancer Father   . Heart disease Father   . Hypertension Father     Social History:  Social History   Socioeconomic History  . Marital status: Single    Spouse name: Not on file  . Number of children: Not on file  . Years of education: Not on file  . Highest education level: Not on file  Occupational History    Employer: [employer]    Comment: Warehouse work  Tobacco Use  . Smoking status: Former Smoker    Packs/day: 2.00    Types: Cigarettes  . Smokeless tobacco: Never Used  Vaping Use  . Vaping Use: Some days  . Substances: Nicotine  Substance and Sexual Activity  . Alcohol use: Yes    Comment: social  . Drug use: No  .  Sexual activity: Not Currently  Other Topics Concern  . Not on file  Social History Narrative   Lives: with parents.   Employment: warehouse work.   Social Determinants of Health   Financial Resource Strain: Low Risk   . Difficulty of Paying Living Expenses: Not hard at all  Food Insecurity: No Food Insecurity  . Worried About Programme researcher, broadcasting/film/video in the Last Year: Never true  . Ran Out of Food in the Last Year: Never true  Transportation Needs: No Transportation Needs  . Lack of Transportation  (Medical): No  . Lack of Transportation (Non-Medical): No  Physical Activity: Insufficiently Active  . Days of Exercise per Week: 3 days  . Minutes of Exercise per Session: 40 min  Stress: Stress Concern Present  . Feeling of Stress : To some extent  Social Connections: Socially Isolated  . Frequency of Communication with Friends and Family: More than three times a week  . Frequency of Social Gatherings with Friends and Family: Twice a week  . Attends Religious Services: Never  . Active Member of Clubs or Organizations: No  . Attends Banker Meetings: Never  . Marital Status: Never married    Allergies:  Allergies  Allergen Reactions  . Ceclor [Cefaclor] Hives    Metabolic Disorder Labs: No results found for: HGBA1C, MPG No results found for: PROLACTIN No results found for: CHOL, TRIG, HDL, CHOLHDL, VLDL, LDLCALC No results found for: TSH  Therapeutic Level Labs: No results found for: LITHIUM No results found for: VALPROATE No components found for:  CBMZ  Current Medications: Current Outpatient Medications  Medication Sig Dispense Refill  . hydrOXYzine (ATARAX/VISTARIL) 10 MG tablet Take 1 tablet (10 mg total) by mouth 3 (three) times daily as needed. 90 tablet 1  . lamoTRIgine (LAMICTAL) 25 MG tablet Take 3 tablets (75 mg total) by mouth daily. 120 tablet 1  . mirtazapine (REMERON) 7.5 MG tablet Take 1 tablet (7.5 mg total) by mouth at bedtime. 30 tablet 2  . azithromycin (ZITHROMAX) 250 MG tablet Take 1 tablet (250 mg total) by mouth daily. Take first 2 tablets together, then 1 every day until finished. (Patient not taking: Reported on 11/09/2019) 6 tablet 0  . benzonatate (TESSALON) 100 MG capsule Take 1 capsule (100 mg total) by mouth 3 (three) times daily as needed for cough. (Patient not taking: Reported on 11/09/2019) 30 capsule 0  . cyclobenzaprine (FLEXERIL) 10 MG tablet Take 1 tablet (10 mg total) by mouth at bedtime. (Patient not taking: Reported on  11/09/2019) 10 tablet 0  . meloxicam (MOBIC) 7.5 MG tablet Take 2 tablets (15 mg total) by mouth daily. (Patient not taking: Reported on 03/29/2020) 30 tablet 0  . predniSONE (DELTASONE) 20 MG tablet Take 2 tablets (40 mg total) by mouth daily. (Patient not taking: Reported on 11/09/2019) 8 tablet 0  . traMADol (ULTRAM) 50 MG tablet Take 1 tablet (50 mg total) by mouth every 6 (six) hours as needed for severe pain. (Patient not taking: Reported on 11/09/2019) 15 tablet 0   No current facility-administered medications for this visit.     Musculoskeletal: Strength & Muscle Tone: within normal limits Gait & Station: normal Patient leans: N/A  Psychiatric Specialty Exam: Review of Systems  Blood pressure 134/88, pulse 96, weight 261 lb (118.4 kg).Body mass index is 35.4 kg/m.  General Appearance: Well Groomed  Eye Contact:  Good  Speech:  Clear and Coherent and Normal Rate  Volume:  Normal  Mood:  Euthymic  Affect:  Congruent  Thought Process:  Coherent, Goal Directed and Linear  Orientation:  Full (Time, Place, and Person)  Thought Content: WDL and Logical   Suicidal Thoughts:  No  Homicidal Thoughts:  No  Memory:  Immediate;   Good Recent;   Good Remote;   Good  Judgement:  Good  Insight:  Good  Psychomotor Activity:  Normal  Concentration:  Concentration: Good and Attention Span: Good  Recall:  Good  Fund of Knowledge: Good  Language: Good  Akathisia:  No  Handed:  Right  AIMS (if indicated): not done  Assets:  Communication Skills Desire for Improvement Financial Resources/Insurance Housing Social Support  ADL's:  Intact  Cognition: WNL  Sleep:  Good   Screenings: GAD-7     Counselor from 03/16/2020 in [facility] Counselor from 02/16/2020 in [facility] Counselor from 12/02/2019 in [facility]  Total GAD-7 Score 5 9 10     PHQ2-9     Counselor from 03/16/2020 in [facility] Counselor from 12/02/2019 in [facility] Office Visit from 11/26/2015 in Primary Care at [facility] Visit from 11/24/2015 in Primary Care at [facility] Total Score 2 5 0 0  PHQ-9 Total Score 7 18 -- --       Assessment and Plan: Patient notes that his anxiety has improved since his last visit. He notes that his mood continues to fluctuate. He is agreeable to increase Lamictal 50 mg to 75 mg for two weeks and then increase to 100 mg. He will continue all other medications as prescribed.    1. Generalized anxiety disorder  Continue- hydrOXYzine (ATARAX/VISTARIL) 10 MG tablet; Take 1 tablet (10 mg total) by mouth 3 (three) times daily as needed.  Dispense: 90 tablet; Refill: 1  2. Bipolar I disorder (HCC)  Increased- lamoTRIgine (LAMICTAL) 25 MG tablet; Take 3 tablets (75 mg total) by mouth daily.  Dispense: 120 tablet; Refill: 1 Continue- mirtazapine (REMERON) 7.5 MG tablet; Take 1 tablet (7.5 mg total) by mouth at bedtime.  Dispense: 30 tablet; Refill: 2   Follow-up in 1 month Follow-up with therapy [facility], NP 03/29/2020, 4:41 PM

## 2020-03-30 ENCOUNTER — Ambulatory Visit (HOSPITAL_COMMUNITY): Payer: Self-pay | Admitting: Licensed Clinical Social Worker

## 2020-04-12 ENCOUNTER — Other Ambulatory Visit: Payer: Self-pay

## 2020-04-12 ENCOUNTER — Ambulatory Visit (INDEPENDENT_AMBULATORY_CARE_PROVIDER_SITE_OTHER): Payer: No Payment, Other | Admitting: Licensed Clinical Social Worker

## 2020-04-12 DIAGNOSIS — F3175 Bipolar disorder, in partial remission, most recent episode depressed: Secondary | ICD-10-CM

## 2020-04-12 DIAGNOSIS — F411 Generalized anxiety disorder: Secondary | ICD-10-CM | POA: Diagnosis not present

## 2020-04-12 NOTE — Progress Notes (Signed)
   THERAPIST PROGRESS NOTE  Session Time: 43  Therapist Response:    Subjective/Objective: Pt was alert and oriented x 5. He was dressed casually in his work uniform. Ernest Jennings presented today with euthymic mood/affect. He was cooperative and maintained good eye contact.   Pt reports primary stressor is work related. Currently pt is considering a career change. 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 Ernest Jennings has a friend in the [employer] department that he is going to reach out too and now has the number to the recruiter. Ernest Jennings reports excitement but also anxiety symptoms that are from a career change. Ernest Jennings states that he wants to do his research on the topic to make sure that the transition goes as smoothly as possibly. LCSW used supportive therapy to help pt through current stressors. Pt reports relief from the encouragement that LCSW was providing.    Assessment/Plan: Pt endorse symptoms for tension, worry, and restlessness. Pt does meet criteria for bipolar disorder in partial remission and GAD. Ernest Jennings has been taking all his medications as prescribed. Plan moving forward is to research becoming a Emergency planning/management officer and make choice off the research that was conducted. Ernest Jennings was agreeable to this plan. Participation Level: Active  Behavioral Response: Casual and GAD and bipolar disorder AlertAnxious  Type of Therapy: Individual Therapy  Treatment Goals addressed: Diagnosis: GAD and bipolar disorder   Interventions: CBT and Supportive  Summary: Ernest Jennings is a 29 y.o. male who presents with bipolar disorder and GAD.   Suicidal/Homicidal: Nowithout intent/plan  Plan: Return again in 4 weeks.     [name], LCSW 04/12/2020

## 2020-04-26 ENCOUNTER — Encounter (HOSPITAL_COMMUNITY): Payer: Self-pay | Admitting: Psychiatry

## 2020-04-26 ENCOUNTER — Ambulatory Visit (INDEPENDENT_AMBULATORY_CARE_PROVIDER_SITE_OTHER): Payer: No Payment, Other | Admitting: Psychiatry

## 2020-04-26 ENCOUNTER — Other Ambulatory Visit: Payer: Self-pay

## 2020-04-26 VITALS — BP 136/82 | HR 92 | Ht 72.0 in | Wt 239.0 lb

## 2020-04-26 DIAGNOSIS — F319 Bipolar disorder, unspecified: Secondary | ICD-10-CM

## 2020-04-26 DIAGNOSIS — F411 Generalized anxiety disorder: Secondary | ICD-10-CM

## 2020-04-26 DIAGNOSIS — F900 Attention-deficit hyperactivity disorder, predominantly inattentive type: Secondary | ICD-10-CM | POA: Diagnosis not present

## 2020-04-26 MED ORDER — ATOMOXETINE HCL 40 MG PO CAPS: 40.0000 mg | ORAL_CAPSULE | Freq: Every day | ORAL | 2 refills | Status: DC

## 2020-04-26 MED ORDER — LAMOTRIGINE 100 MG PO TABS: 100.0000 mg | ORAL_TABLET | Freq: Every day | ORAL | 2 refills | Status: DC

## 2020-04-26 MED ORDER — MIRTAZAPINE 7.5 MG PO TABS: 7.5000 mg | ORAL_TABLET | Freq: Every day | ORAL | 2 refills | Status: DC

## 2020-04-26 MED ORDER — HYDROXYZINE HCL 10 MG PO TABS: 10.0000 mg | ORAL_TABLET | Freq: Three times a day (TID) | ORAL | 2 refills | Status: DC | PRN

## 2020-04-26 NOTE — Progress Notes (Signed)
BH MD/PA/NP OP Progress Note  04/26/2020 4:02 PM Ernest Jennings  MRN:  [id number]  Chief Complaint: "My mood still fluctuates but my anxiety is better"    HPI: 29 year old male seen today for followup psychiatric evaluation.  He has a psychiatric history of generalized anxiety, adjustment disorder,ADHD and Bipolar 1. He is currently managed on Lamictal 75 mg,Remeron 7.5 mg, and hydroxyzine 10 mg three times daily. He notes his medications as effective in managing his psychiatric conditions.   Today he is well groomed, pleasant, cooperative, engaged in conversation, and maintained eye contact. Patient informed provider that his mood is stable however notes that at times he is still distractible and believes that he has ADHD.  He informed provider that he was diagnosed with ADHD as a child and trialed Adderall, Vyvanse, and Ritalin.  He noted that each of these medications caused increased agitation and anger.  He endorses forgetfulness, disorganization, and avoidance of mentally taxing single task.   He also informed provider that at times he is anxious and depressed however noted that he believes his medications are effective in managing his depression and anxiety.  Provider conducted a GAD-7 and patient scored a 10.  Provider also conducted a PHQ 9 and patient scored a 7.  He endorsed adequate sleep and adequate appetite.  He denied SI/HI/VH or paranoia.    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  He however reports that he would like to join the police academy next year.   Patient agreeable to start Strattera to help manage symptoms of ADHD.Potential side effects of medication and risks vs benefits of treatment vs non-treatment were explained and discussed. All questions were answered.   will continue all other medications as prescribed.  He will also follow-up with outpatient counseling for therapy.  No other concerns noted at this time.   Visit Diagnosis:     ICD-10-CM   1. Attention deficit hyperactivity disorder (ADHD), predominantly inattentive type  F90.0 atomoxetine (STRATTERA) 40 MG capsule  2. Generalized anxiety disorder  F41.1 hydrOXYzine (ATARAX/VISTARIL) 10 MG tablet  3. Bipolar I disorder (HCC)  F31.9 lamoTRIgine (LAMICTAL) 100 MG tablet    mirtazapine (REMERON) 7.5 MG tablet    Past Psychiatric History:  Anxiety, ADHD, Adjustment disorder with mixed anxiety/depression, andunspecified psychosis   Past Medical History:  Past Medical History:  Diagnosis Date  . Allergy   . Anxiety   . Bronchitis    No past surgical history on file.  Family Psychiatric History: Maternal cousin Bipolar disorder  Family History:  Family History  Problem Relation Age of Onset  . Cancer Maternal Grandmother   . Heart disease Maternal Grandfather   . Cancer Mother   . Diabetes Mother   . Heart disease Mother   . Cancer Father   . Heart disease Father   . Hypertension Father     Social History:  Social History   Socioeconomic History  . Marital status: Single    Spouse name: Not on file  . Number of children: Not on file  . Years of education: Not on file  . Highest education level: Not on file  Occupational History    Employer: [employer]    Comment: Warehouse work  Tobacco Use  . Smoking status: Former Smoker    Packs/day: 2.00    Types: Cigarettes  . Smokeless tobacco: Never Used  Vaping Use  . Vaping Use: Some days  . Substances: Nicotine  Substance  and Sexual Activity  . Alcohol use: Yes    Comment: social  . Drug use: No  . Sexual activity: Not Currently  Other Topics Concern  . Not on file  Social History Narrative   Lives: with parents.   Employment: warehouse work.   Social Determinants of Health   Financial Resource Strain: Low Risk   . Difficulty of Paying Living Expenses: Not hard at all  Food Insecurity: No Food Insecurity  . Worried About Programme researcher, broadcasting/film/video in the Last Year: Never true  . Ran  Out of Food in the Last Year: Never true  Transportation Needs: No Transportation Needs  . Lack of Transportation (Medical): No  . Lack of Transportation (Non-Medical): No  Physical Activity: Insufficiently Active  . Days of Exercise per Week: 3 days  . Minutes of Exercise per Session: 40 min  Stress: Stress Concern Present  . Feeling of Stress : To some extent  Social Connections: Socially Isolated  . Frequency of Communication with Friends and Family: More than three times a week  . Frequency of Social Gatherings with Friends and Family: Twice a week  . Attends Religious Services: Never  . Active Member of Clubs or Organizations: No  . Attends Banker Meetings: Never  . Marital Status: Never married    Allergies:  Allergies  Allergen Reactions  . Ceclor [Cefaclor] Hives    Metabolic Disorder Labs: No results found for: HGBA1C, MPG No results found for: PROLACTIN No results found for: CHOL, TRIG, HDL, CHOLHDL, VLDL, LDLCALC No results found for: TSH  Therapeutic Level Labs: No results found for: LITHIUM No results found for: VALPROATE No components found for:  CBMZ  Current Medications: Current Outpatient Medications  Medication Sig Dispense Refill  . atomoxetine (STRATTERA) 40 MG capsule Take 1 capsule (40 mg total) by mouth daily. 30 capsule 2  . azithromycin (ZITHROMAX) 250 MG tablet Take 1 tablet (250 mg total) by mouth daily. Take first 2 tablets together, then 1 every day until finished. (Patient not taking: Reported on 11/09/2019) 6 tablet 0  . benzonatate (TESSALON) 100 MG capsule Take 1 capsule (100 mg total) by mouth 3 (three) times daily as needed for cough. (Patient not taking: Reported on 11/09/2019) 30 capsule 0  . cyclobenzaprine (FLEXERIL) 10 MG tablet Take 1 tablet (10 mg total) by mouth at bedtime. (Patient not taking: Reported on 11/09/2019) 10 tablet 0  . hydrOXYzine (ATARAX/VISTARIL) 10 MG tablet Take 1 tablet (10 mg total) by mouth 3 (three)  times daily as needed. 90 tablet 2  . lamoTRIgine (LAMICTAL) 100 MG tablet Take 1 tablet (100 mg total) by mouth daily. 30 tablet 2  . meloxicam (MOBIC) 7.5 MG tablet Take 2 tablets (15 mg total) by mouth daily. (Patient not taking: Reported on 03/29/2020) 30 tablet 0  . mirtazapine (REMERON) 7.5 MG tablet Take 1 tablet (7.5 mg total) by mouth at bedtime. 30 tablet 2  . predniSONE (DELTASONE) 20 MG tablet Take 2 tablets (40 mg total) by mouth daily. (Patient not taking: Reported on 11/09/2019) 8 tablet 0  . traMADol (ULTRAM) 50 MG tablet Take 1 tablet (50 mg total) by mouth every 6 (six) hours as needed for severe pain. (Patient not taking: Reported on 11/09/2019) 15 tablet 0   No current facility-administered medications for this visit.     Musculoskeletal: Strength & Muscle Tone: within normal limits Gait & Station: normal Patient leans: N/A  Psychiatric Specialty Exam: Review of Systems  There were no  vitals taken for this visit.There is no height or weight on file to calculate BMI.  General Appearance: Well Groomed  Eye Contact:  Good  Speech:  Clear and Coherent and Normal Rate  Volume:  Normal  Mood:  Euthymic  Affect:  Congruent  Thought Process:  Coherent, Goal Directed and Linear  Orientation:  Full (Time, Place, and Person)  Thought Content: WDL and Logical   Suicidal Thoughts:  No  Homicidal Thoughts:  No  Memory:  Immediate;   Good Recent;   Good Remote;   Good  Judgement:  Good  Insight:  Good  Psychomotor Activity:  Normal  Concentration:  Concentration: Good and Attention Span: Good  Recall:  Good  Fund of Knowledge: Good  Language: Good  Akathisia:  No  Handed:  Right  AIMS (if indicated): not done  Assets:  Communication Skills Desire for Improvement Financial Resources/Insurance Housing Social Support  ADL's:  Intact  Cognition: WNL  Sleep:  Good   Screenings: GAD-7     Clinical Support from 04/26/2020 in [facility]  Counselor from 03/16/2020 in [facility] Counselor from 02/16/2020 in [facility] Counselor from 12/02/2019 in [facility]  Total GAD-7 Score 10 5 9 10     PHQ2-9     Clinical Support from 04/26/2020 in [facility] Counselor from 03/16/2020 in [facility] Counselor from 12/02/2019 in [facility] Office Visit from 11/26/2015 in Primary Care at [facility] Visit from 11/24/2015 in Primary Care at [facility] Total Score 2 2 5  0 0  PHQ-9 Total Score 7 7 18  -- --       Assessment and Plan: Patient endorses symptoms of ADHD.  He however notes that his mood is stable and reports that he is able to cope with his anxiety/depression.  He is agreeable to starting Strattera 40 mg daily to help manage symptoms of ADHD.  He will continue all other medications prescribed.  1. Generalized anxiety disorder  Continue- hydrOXYzine (ATARAX/VISTARIL) 10 MG tablet; Take 1 tablet (10 mg total) by mouth 3 (three) times daily as needed.  Dispense: 90 tablet; Refill: 2  2. Bipolar I disorder (HCC)  Continue- lamoTRIgine (LAMICTAL) 100 MG tablet; Take 1 tablet (100 mg total) by mouth daily.  Dispense: 30 tablet; Refill: 2 Continue- mirtazapine (REMERON) 7.5 MG tablet; Take 1 tablet (7.5 mg total) by mouth at bedtime.  Dispense: 30 tablet; Refill: 2  3. Attention deficit hyperactivity disorder (ADHD), predominantly inattentive type  Start- atomoxetine (STRATTERA) 40 MG capsule; Take 1 capsule (40 mg total) by mouth daily.  Dispense: 30 capsule; Refill: 2   Follow-up in 3 month Follow-up with therapy  [facility], NP 04/26/2020, 4:02 PM

## 2020-05-04 ENCOUNTER — Other Ambulatory Visit: Payer: Self-pay

## 2020-05-04 ENCOUNTER — Ambulatory Visit (INDEPENDENT_AMBULATORY_CARE_PROVIDER_SITE_OTHER): Payer: No Payment, Other | Admitting: Licensed Clinical Social Worker

## 2020-05-04 DIAGNOSIS — F411 Generalized anxiety disorder: Secondary | ICD-10-CM | POA: Diagnosis not present

## 2020-05-04 DIAGNOSIS — F3175 Bipolar disorder, in partial remission, most recent episode depressed: Secondary | ICD-10-CM

## 2020-05-04 NOTE — Progress Notes (Signed)
   THERAPIST PROGRESS NOTE  Session Time: 42  Therapist Response:    Subjective/Objective: Pt was alert and oriented x 5. He was dressed casually and groomed fairly. Ernest Jennings presented today with euthymic mood/affect. He was cooperative and maintained good eye contact.   Pt primary stressor today is work related. 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 Pt is two payment away, once those two payments are made pt wants to apply to [facility]. Ernest Jennings has been waiting and normally after you apply, they contact your employer. LCSW supportive therapy is today's session, working through the stressor of managing current employment and attempting to investigate a new career. Ernest Jennings reports relief from intervention.    Assessment/Plan: Pt endorse symptoms for restlessness, tension, worry, and fatigue. Currently he meets criteria for GAD and bipolar disorder in remission. Ernest Jennings has been taking all medications as prescribed. 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 male who presents with GAD and bipolar disorder in partial remission.   Suicidal/Homicidal: NAwithout intent/plan    Plan: Return again in 4 weeks.      [name], LCSW 05/04/2020

## 2020-06-08 ENCOUNTER — Ambulatory Visit (INDEPENDENT_AMBULATORY_CARE_PROVIDER_SITE_OTHER): Payer: No Payment, Other | Admitting: Licensed Clinical Social Worker

## 2020-06-08 ENCOUNTER — Other Ambulatory Visit: Payer: Self-pay

## 2020-06-08 DIAGNOSIS — F3177 Bipolar disorder, in partial remission, most recent episode mixed: Secondary | ICD-10-CM

## 2020-06-08 DIAGNOSIS — F411 Generalized anxiety disorder: Secondary | ICD-10-CM

## 2020-06-08 NOTE — Progress Notes (Signed)
   THERAPIST PROGRESS NOTE  Session Time: 9  Therapist Response:    Subjective/Objective:  Pt was alert and oriented x 5. He was dressed casually and groomed well. He presented today with anxious and irritable mood/affect. Breyer was cooperative and maintained good eye contact.   Pt was running late to appointment due to a family emergency. Pt stated that his aunt had a diabetic crisis which is why he was 13 minutes late to the appointment. Dat primary stressor has been working and quitting smoking. He quit for 4 days on Jan 1st. He did have a cigarette yesterday but has only had that one in the past 5 days. LCSW provided education material about quitting smoking and told pt to discuss different options with Dr. Doyne Keel, although this maybe better discussed with PCP. Tyreke reported understanding.   Work remains pt primary stressor. 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 Aadit is not worried in a decrease of hours, but the lack of schedule has caused the pt to feel over worked. Zarin is continuing to talk to [facility] [employer] about an attempt to join the police force.   Plan/Intervention: Primary intervention utilized today was supportive and CBT based. Deamonte continues to build coping skills for exercise 2 to 3 times per week. He is also continuing to cut down on smoking, since he started with LCSW pt went from 3 to 4 packs a week to 1 to 2 packs prior to this attempt to quit "cold [place]". Plan moving forward will be to continue to progress his cut down of smoking and maintain it. He will also increase exercise on a weekly basis.    Assessment: Pt endorse symptoms for tension, worry, irritability, and restlessness. He continues to meet criteria for GAD and bipolar 1 in partial remission. He Has been taking all his medication on a regular basis minus his ADHD medication. Pt will f/u with  LCSW in 4 weeks as pt has cut his frequency of seeing LCSW from biweekly to 1 x monthly.  Participation Level: Active  Behavioral Response: Casual and Fairly GroomedAlertAnxious and Irritable  Type of Therapy: Individual Therapy  Treatment Goals addressed: Diagnosis: GAD  Interventions: CBT and Supportive  Summary: RONIN REHFELDT is a 30 y.o. male who presents with GAD and bipolar disorder.   Suicidal/Homicidal: NAwithout intent/plan   Plan: Return again in 4 weeks.     [name], LCSW 06/08/2020

## 2020-07-12 ENCOUNTER — Other Ambulatory Visit: Payer: Self-pay

## 2020-07-12 ENCOUNTER — Ambulatory Visit (INDEPENDENT_AMBULATORY_CARE_PROVIDER_SITE_OTHER): Payer: No Payment, Other | Admitting: Licensed Clinical Social Worker

## 2020-07-12 DIAGNOSIS — F411 Generalized anxiety disorder: Secondary | ICD-10-CM

## 2020-07-12 NOTE — Progress Notes (Signed)
   THERAPIST PROGRESS NOTE  Session Time: 4   Therapist Response:    Subjective/Objective:  Pt was alert and oriented x 5. He was dressed casually and engaged well in session. Ernest Jennings presented today with euthymic mood/affect. He engaged well and was cooperative.   Pt presented today with primary stressor of work. He is still not enjoying his job he continues to search for alterative. 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 He is in contact with a recruiter and working towards that goal. He reports barriers to switching jobs is the fact he bought a car from the owner of the [employer] he works at. He is still awaiting the salvaged title from the [facility] and payments are taken out of his checks. Ernest Jennings reports once the car is paid off with title in hand this will make it easier to leave.   Plan/intervention: LCSW administered the PHQ-9, on 11/23 pt score at 10. This was down from original score in June of 18. Today for PHQ-9 pt score a 4. LCSW also administered GAD-7 original score was 10, score on 11/23 was a 7. Today pt scored a 4. Pt still progressively decreasing score in 11th session of therapy. Plan will be to continue to see pt 1 x monthly. He will still work towards goal of new career.    Assessment/Plan: Pt endorse symptoms to tension and worry. He meets criteria for GAD. Pt continues to take all his medications as directed but states he forgets about his ADHD medications in the morning. Pt has been progressing in therapy as evidence by above note. LCSW has pt scheduled out for March and April if pt continue to stabilize with PHQ-9 and GAD-7 he will be discharged from therapy.   Participation Level: Active  Behavioral Response: Casual and Fairly GroomedAlertEuthymic  Type of Therapy: Individual Therapy  Treatment Goals addressed: Diagnosis: GAD  Interventions: CBT and Supportive  Summary: Ernest Jennings is a 30 y.o. male who presents with GAD.    Suicidal/Homicidal: Nowithout intent/plan  Plan: Return again in 4 weeks.     [name], LCSW 07/12/2020

## 2020-07-27 ENCOUNTER — Other Ambulatory Visit: Payer: Self-pay

## 2020-07-27 ENCOUNTER — Ambulatory Visit (INDEPENDENT_AMBULATORY_CARE_PROVIDER_SITE_OTHER): Payer: No Payment, Other | Admitting: Psychiatry

## 2020-07-27 ENCOUNTER — Encounter (HOSPITAL_COMMUNITY): Payer: Self-pay | Admitting: Psychiatry

## 2020-07-27 DIAGNOSIS — F319 Bipolar disorder, unspecified: Secondary | ICD-10-CM

## 2020-07-27 DIAGNOSIS — F411 Generalized anxiety disorder: Secondary | ICD-10-CM

## 2020-07-27 DIAGNOSIS — F900 Attention-deficit hyperactivity disorder, predominantly inattentive type: Secondary | ICD-10-CM

## 2020-07-27 MED ORDER — HYDROXYZINE HCL 10 MG PO TABS: 10.0000 mg | ORAL_TABLET | Freq: Three times a day (TID) | ORAL | 2 refills | Status: DC | PRN

## 2020-07-27 MED ORDER — LAMOTRIGINE 100 MG PO TABS: 100.0000 mg | ORAL_TABLET | Freq: Every day | ORAL | 2 refills | Status: DC

## 2020-07-27 MED ORDER — ATOMOXETINE HCL 40 MG PO CAPS: 40.0000 mg | ORAL_CAPSULE | Freq: Every day | ORAL | 2 refills | Status: DC

## 2020-07-27 MED ORDER — MIRTAZAPINE 7.5 MG PO TABS: 7.5000 mg | ORAL_TABLET | Freq: Every day | ORAL | 2 refills | Status: DC

## 2020-07-27 NOTE — Progress Notes (Signed)
BH MD/PA/NP OP Progress Note  07/27/2020 2:59 PM Ernest Jennings  MRN:  [id number]  Chief Complaint: "My mood still fluctuates but my anxiety is better"  Chief Complaint    Medication Management      HPI: 30 year old male seen today for followup psychiatric evaluation.  He has a psychiatric history of generalized anxiety, adjustment disorder,ADHD and Bipolar 1. He is currently managed on Lamictal 100 mg daily,Strattera 40 mg daily, Remeron 7.5 mg, and hydroxyzine 10 mg three times daily. He notes his medications as effective in managing his psychiatric conditions.   Today he is well groomed, pleasant, cooperative, engaged in conversation, and maintained eye contact. He informed provider that he has minimal anxiety, depression, and notes that him mood is stable.  Provider conducted a GAD-7 and patient scored a 4, at his last visit he scored a 4.  Provider also conducted a PHQ-9 and patient scored a 3, at his last visit he scored a 4.  He notes that he like Strattera over stimulants that he has taken in the past such as Adderall, Vyvanse, and Ritalin because he notes he does not get angry after taking it.  He informed provider that his sleep has improved and notes that he sleeps 5 to 6 hours instead of 1 to 2 hours nightly.  He endorses having adequate appetite.  Today he denies SI/HI/VAH or paranoia.    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   No medication changes made today.  Patient agreeable to continue medication as prescribed.  He will follow up with outpatient counseling for therapy.  No other concerns noted at this time.   Visit Diagnosis:    ICD-10-CM   1. Attention deficit hyperactivity disorder (ADHD), predominantly inattentive type  F90.0 atomoxetine (STRATTERA) 40 MG capsule  2. Generalized anxiety disorder  F41.1 hydrOXYzine  (ATARAX/VISTARIL) 10 MG tablet  3. Bipolar I disorder (HCC)  F31.9 lamoTRIgine (LAMICTAL) 100 MG tablet    mirtazapine (REMERON) 7.5 MG tablet    Past Psychiatric History:  Anxiety, ADHD, Adjustment disorder with mixed anxiety/depression, andunspecified psychosis   Past Medical History:  Past Medical History:  Diagnosis Date  . Allergy   . Anxiety   . Bronchitis    History reviewed. No pertinent surgical history.  Family Psychiatric History: Maternal cousin Bipolar disorder  Family History:  Family History  Problem Relation Age of Onset  . Cancer Maternal Grandmother   . Heart disease Maternal Grandfather   . Cancer Mother   . Diabetes Mother   . Heart disease Mother   . Cancer Father   . Heart disease Father   . Hypertension Father     Social History:  Social History   Socioeconomic History  . Marital status: Single    Spouse name: Not on file  . Number of children: Not on file  . Years of education: Not on file  . Highest education level: Not on file  Occupational History    Employer: [employer]    Comment: Warehouse work  Tobacco Use  . Smoking status: Former Smoker    Packs/day: 2.00    Types: Cigarettes  . Smokeless tobacco: Never Used  Vaping Use  . Vaping Use: Some days  . Substances: Nicotine  Substance and Sexual Activity  . Alcohol use: Yes  Comment: social  . Drug use: No  . Sexual activity: Not Currently  Other Topics Concern  . Not on file  Social History Narrative   Lives: with parents.   Employment: warehouse work.   Social Determinants of Health   Financial Resource Strain: Low Risk   . Difficulty of Paying Living Expenses: Not hard at all  Food Insecurity: No Food Insecurity  . Worried About Programme researcher, broadcasting/film/video in the Last Year: Never true  . Ran Out of Food in the Last Year: Never true  Transportation Needs: No Transportation Needs  . Lack of Transportation (Medical): No  . Lack of Transportation (Non-Medical): No   Physical Activity: Insufficiently Active  . Days of Exercise per Week: 3 days  . Minutes of Exercise per Session: 40 min  Stress: Stress Concern Present  . Feeling of Stress : To some extent  Social Connections: Socially Isolated  . Frequency of Communication with Friends and Family: More than three times a week  . Frequency of Social Gatherings with Friends and Family: Twice a week  . Attends Religious Services: Never  . Active Member of Clubs or Organizations: No  . Attends Banker Meetings: Never  . Marital Status: Never married    Allergies:  Allergies  Allergen Reactions  . Ceclor [Cefaclor] Hives    Metabolic Disorder Labs: No results found for: HGBA1C, MPG No results found for: PROLACTIN No results found for: CHOL, TRIG, HDL, CHOLHDL, VLDL, LDLCALC No results found for: TSH  Therapeutic Level Labs: No results found for: LITHIUM No results found for: VALPROATE No components found for:  CBMZ  Current Medications: Current Outpatient Medications  Medication Sig Dispense Refill  . atomoxetine (STRATTERA) 40 MG capsule Take 1 capsule (40 mg total) by mouth daily. 30 capsule 2  . azithromycin (ZITHROMAX) 250 MG tablet Take 1 tablet (250 mg total) by mouth daily. Take first 2 tablets together, then 1 every day until finished. 6 tablet 0  . hydrOXYzine (ATARAX/VISTARIL) 10 MG tablet Take 1 tablet (10 mg total) by mouth 3 (three) times daily as needed. 90 tablet 2  . lamoTRIgine (LAMICTAL) 100 MG tablet Take 1 tablet (100 mg total) by mouth daily. 30 tablet 2  . meloxicam (MOBIC) 7.5 MG tablet Take 2 tablets (15 mg total) by mouth daily. 30 tablet 0  . mirtazapine (REMERON) 7.5 MG tablet Take 1 tablet (7.5 mg total) by mouth at bedtime. 30 tablet 2  . predniSONE (DELTASONE) 20 MG tablet Take 2 tablets (40 mg total) by mouth daily. 8 tablet 0   No current facility-administered medications for this visit.     Musculoskeletal: Strength & Muscle Tone: within  normal limits Gait & Station: normal Patient leans: N/A  Psychiatric Specialty Exam: Review of Systems  Blood pressure (!) 147/76, pulse 93, height 6' (1.829 m), weight 258 lb 9.6 oz (117.3 kg), SpO2 99 %.Body mass index is 35.07 kg/m.  General Appearance: Well Groomed  Eye Contact:  Good  Speech:  Clear and Coherent and Normal Rate  Volume:  Normal  Mood:  Euthymic  Affect:  Congruent  Thought Process:  Coherent, Goal Directed and Linear  Orientation:  Full (Time, Place, and Person)  Thought Content: WDL and Logical   Suicidal Thoughts:  No  Homicidal Thoughts:  No  Memory:  Immediate;   Good Recent;   Good Remote;   Good  Judgement:  Good  Insight:  Good  Psychomotor Activity:  Normal  Concentration:  Concentration: Good  and Attention Span: Good  Recall:  Good  Fund of Knowledge: Good  Language: Good  Akathisia:  No  Handed:  Right  AIMS (if indicated): not done  Assets:  Communication Skills Desire for Improvement Financial Resources/Insurance Housing Social Support  ADL's:  Intact  Cognition: WNL  Sleep:  Good   Screenings: GAD-7   Flowsheet Row Clinical Support from 07/27/2020 in [facility] Counselor from 07/12/2020 in [facility] Clinical Support from 04/26/2020 in [facility] Counselor from 03/16/2020 in [facility] Counselor from 02/16/2020 in [facility]  Total GAD-7 Score 4 4 10 5 9     PHQ2-9   Flowsheet Row Clinical Support from 07/27/2020 in [facility] Counselor from 07/12/2020 in [facility] Clinical Support from 04/26/2020 in [facility] Counselor from 03/16/2020 in [facility] Counselor from 12/02/2019 in [facility]  PHQ-2 Total Score 1 1 2 2 5   PHQ-9 Total Score 3 4 7 7 18      Flowsheet Row Clinical Support from 07/27/2020 in [facility] Counselor from 07/12/2020 in [facility]  C-SSRS RISK CATEGORY No Risk No Risk       Assessment and Plan: Patient reports that he is doing well on his current medication regimen.  No medication changes made today.  Patient will continue all medications as prescribed.  1. Generalized anxiety disorder  Continue- hydrOXYzine (ATARAX/VISTARIL) 10 MG tablet; Take 1 tablet (10 mg total) by mouth 3 (three) times daily as needed.  Dispense: 90 tablet; Refill: 2  2. Bipolar I disorder (HCC)  Continue- lamoTRIgine (LAMICTAL) 100 MG tablet; Take 1 tablet (100 mg total) by mouth daily.  Dispense: 30 tablet; Refill: 2 Continue- mirtazapine (REMERON) 7.5 MG tablet; Take 1 tablet (7.5 mg total) by mouth at bedtime.  Dispense: 30 tablet; Refill: 2  3. Attention deficit hyperactivity disorder (ADHD), predominantly inattentive type  Start- atomoxetine (STRATTERA) 40 MG capsule; Take 1 capsule (40 mg total) by mouth daily.  Dispense: 30 capsule; Refill: 2   Follow-up in 3 month Follow-up with therapy  [facility], NP 07/27/2020, 2:59 PM

## 2020-08-03 ENCOUNTER — Ambulatory Visit (INDEPENDENT_AMBULATORY_CARE_PROVIDER_SITE_OTHER): Payer: No Payment, Other | Admitting: Licensed Clinical Social Worker

## 2020-08-03 ENCOUNTER — Other Ambulatory Visit: Payer: Self-pay

## 2020-08-03 DIAGNOSIS — F411 Generalized anxiety disorder: Secondary | ICD-10-CM

## 2020-08-03 DIAGNOSIS — F319 Bipolar disorder, unspecified: Secondary | ICD-10-CM

## 2020-08-03 NOTE — Progress Notes (Signed)
   THERAPIST PROGRESS NOTE  Session Time: 19  Therapist Response:    Subjective/Objective:  Pt was alert and oriented x 5. 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 Ernest Jennings presented with euthymic mood/affect He was cooperative and maintained good eye contact.   Primary stressor today is financials. Pt states that the car he bought from his boss for $2200 has a lot more problems than what he thought. It will probably cost 1 to 2 grand minimum to get it up and running right. 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 Ernest Jennings also reports that his divorce after almost 2 years will be final. His ex-spouse will be getting off his phone line which will help financially.   Pt endorses symptoms for depression and anxiety for irritability, insomnia, restlessness, tension and worry. He does meet criteria for GAD currently and bipolar 1 in partial remission. Ernest Jennings is taking his medications as instructed.   Plan: LCSW utilize supportive therapy in today's session. Pt to exercise 3 x weekly currently he is exercising 1 to 2 time per week, pt also to only smoke 1 pack of cigarettes per week currently he is at 2 to 3.      Participation Level: Active  Behavioral Response: Casual and Fairly GroomedAlertEuthymic  Type of Therapy: Individual Therapy  Treatment Goals addressed: Diagnosis: depression and anxiety   Interventions: CBT and Supportive  Summary: Ernest Jennings is a 30 y.o. male who presents with: Gad and bipolar 1   Suicidal/Homicidal: Nowithout intent/plan   Plan: Return again in 2 weeks.   [name], LCSW 08/03/2020

## 2020-09-06 ENCOUNTER — Other Ambulatory Visit: Payer: Self-pay

## 2020-09-06 ENCOUNTER — Ambulatory Visit (INDEPENDENT_AMBULATORY_CARE_PROVIDER_SITE_OTHER): Payer: No Payment, Other | Admitting: Licensed Clinical Social Worker

## 2020-09-06 DIAGNOSIS — F319 Bipolar disorder, unspecified: Secondary | ICD-10-CM

## 2020-09-06 DIAGNOSIS — F411 Generalized anxiety disorder: Secondary | ICD-10-CM

## 2020-09-06 NOTE — Progress Notes (Signed)
   THERAPIST PROGRESS NOTE  Session Time: 75   Therapist Response:    Subjective/Objective:   Pt was alert and oriented x 5. 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 Ernest Jennings presented with euthymic mood/affect/. He was cooperative and maintained good eye contact.   Primary stressor is work and family conflict. Ernest Jennings has been considering a new career path for the last 6 months. 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 Ernest Jennings has a scheduled a visit to visit his cousin May 5th through the 11th. Pt will see how he likes it and review job opportunities. Ernest Jennings also reports that he has been sleeping well minus nightmare 2 to 3 times per week.    Assessment/Plan: Pt endorses symptoms for nightmares, fatigue, overeating, poor concentration, tension worry, always on the go, He currently meets criteria from prior notes for Bipolar disorder, GAD, and ADHD. He reports that he is taking his medications as prescribed. Objectives remain to work out 3 x per week, and new objective to decrease nightmare to 1 x weekly. Pt will f/u with LCSW in 4 weeks.  Participation Level: Active  Behavioral Response: CasualAlertAnxious and Depressed  Type of Therapy: Individual Therapy  Treatment Goals addressed: Diagnosis: depression and anxiety   Interventions: CBT and Supportive  Summary: Ernest Jennings is a 30 y.o. male who presents with major depression and GAD.   Suicidal/Homicidal: NAwithout intent/plan    Plan: Return again in 4  weeks.   Ernest Cooks, LCSW 09/06/2020

## 2020-10-04 ENCOUNTER — Ambulatory Visit (INDEPENDENT_AMBULATORY_CARE_PROVIDER_SITE_OTHER): Payer: No Payment, Other | Admitting: Licensed Clinical Social Worker

## 2020-10-04 ENCOUNTER — Other Ambulatory Visit: Payer: Self-pay

## 2020-10-04 DIAGNOSIS — F319 Bipolar disorder, unspecified: Secondary | ICD-10-CM

## 2020-10-04 DIAGNOSIS — F411 Generalized anxiety disorder: Secondary | ICD-10-CM

## 2020-10-04 NOTE — Progress Notes (Signed)
   THERAPIST PROGRESS NOTE  Session Time: 67  Participation Level: Active  Behavioral Response: CasualAlertAnxious and Depressed  Type of Therapy: Individual Therapy  Treatment Goals addressed: Diagnosis: GAD, bipolar 1 depression,   Interventions: CBT and Supportive  Summary: Ernest Jennings is a 30 y.o. male who presents with bipolar depression GAD.   Suicidal/Homicidal: NAwithout intent/plan  Therapist Response:    Subjective/Objective:  Pt was alert and oriented x 5. He was dressed casually and engaged well in therapy session. Pt presented with euthymic mood/affect. He was cooperative and maintained good eye contact.   Pt primary stressor continues to be work. 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 He was working from 1 AM to 4AM, before being able to sleep. He states that the irregular sleep can be stressful at times. He did report a positive of going to [place] on vacation to visit family. [name] reports this could lead to possibility of a job in the area if he likes it as they pay better for tow truck drivers.     Assessment/Plan: Pt endorse symptoms for fatigues, restlessness, and worry. He currently meets criteria for GAD. Pt is meeting criteria for GAD. He is taking his medications as prescribed. Plan to continue to attempt to work out 3/7 days per week currently pt is only working out 1 to 2 x weekly. He will f/u with LCSW in 4 weeks.  Plan: Return again in 4  weeks.     [name], LCSW 10/04/2020

## 2020-10-20 ENCOUNTER — Ambulatory Visit (INDEPENDENT_AMBULATORY_CARE_PROVIDER_SITE_OTHER): Payer: No Payment, Other | Admitting: Psychiatry

## 2020-10-20 ENCOUNTER — Encounter (HOSPITAL_COMMUNITY): Payer: Self-pay | Admitting: Psychiatry

## 2020-10-20 ENCOUNTER — Other Ambulatory Visit: Payer: Self-pay

## 2020-10-20 DIAGNOSIS — F900 Attention-deficit hyperactivity disorder, predominantly inattentive type: Secondary | ICD-10-CM | POA: Diagnosis not present

## 2020-10-20 DIAGNOSIS — F411 Generalized anxiety disorder: Secondary | ICD-10-CM

## 2020-10-20 DIAGNOSIS — F319 Bipolar disorder, unspecified: Secondary | ICD-10-CM | POA: Diagnosis not present

## 2020-10-20 MED ORDER — MIRTAZAPINE 7.5 MG PO TABS: 7.5000 mg | ORAL_TABLET | Freq: Every day | ORAL | 4 refills | Status: AC

## 2020-10-20 MED ORDER — ATOMOXETINE HCL 40 MG PO CAPS: 40.0000 mg | ORAL_CAPSULE | Freq: Every day | ORAL | 4 refills | Status: AC

## 2020-10-20 MED ORDER — LAMOTRIGINE 100 MG PO TABS: 100.0000 mg | ORAL_TABLET | Freq: Every day | ORAL | 4 refills | Status: AC

## 2020-10-20 MED ORDER — HYDROXYZINE HCL 10 MG PO TABS: 10.0000 mg | ORAL_TABLET | Freq: Three times a day (TID) | ORAL | 4 refills | Status: AC | PRN

## 2020-10-20 NOTE — Progress Notes (Signed)
[place] MD/PA/NP OP Progress Note  10/20/2020 1:29 PM Ernest Jennings  MRN:  [id number]  Chief Complaint: "I am moving to [place] and have not set up with another psych doctor"    HPI: 30 year old male seen today for followup psychiatric evaluation.  He has a psychiatric history of generalized anxiety, adjustment disorder,ADHD and Bipolar 1. He is currently managed on Lamictal 100 mg daily,Strattera 40 mg daily, Remeron 7.5 mg, and hydroxyzine 10 mg three times daily. He notes his medications as effective in managing his psychiatric conditions.   Today he is well groomed, pleasant, cooperative, engaged in conversation, and maintained eye contact. He informed provider that today he is feeling a little tired.  He notes that he has bronchitis and feels drained.  He notes that his anxiety, depression, and mood are stable on his medications.  He informed provider that he will be moving to [place] in 2 weeks and request that provider give him more refills if he has not set up care with another psychiatric provider.  Today provider conducted a GAD-7 and patient scored a 3, at his last visit he scored a 4.  Provider also conducted a PHQ-9 and patient scored a 0, at her last visit he scored a 3.  He endorses adequate sleep and appetite.  Patient notes that he has lost 10 pounds because he has been working out more and dieting. Today he denies SI/HI/VAH, mania, or paranoia.    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  He notes that he has a position at [employer].  He informed Probation officer that he will be living with his aunt and future uncle.  He also notes that he has met a male in [place] and is looking forward to getting to know her.  No medication changes made today.  Patient agreeable to continue medication as prescribed.  Provider reordered medications with 4 refills.  No other concerns noted at this time.   Visit Diagnosis:    ICD-10-CM   1. Attention  deficit hyperactivity disorder (ADHD), predominantly inattentive type  F90.0 atomoxetine (STRATTERA) 40 MG capsule  2. Generalized anxiety disorder  F41.1 hydrOXYzine (ATARAX/VISTARIL) 10 MG tablet  3. Bipolar I disorder (HCC)  F31.9 lamoTRIgine (LAMICTAL) 100 MG tablet    mirtazapine (REMERON) 7.5 MG tablet    Past Psychiatric History:  Anxiety, ADHD, Adjustment disorder with mixed anxiety/depression, andunspecified psychosis   Past Medical History:  Past Medical History:  Diagnosis Date  . Allergy   . Anxiety   . Bronchitis    History reviewed. No pertinent surgical history.  Family Psychiatric History: Maternal cousin Bipolar disorder  Family History:  Family History  Problem Relation Age of Onset  . Cancer Maternal Grandmother   . Heart disease Maternal Grandfather   . Cancer Mother   . Diabetes Mother   . Heart disease Mother   . Cancer Father   . Heart disease Father   . Hypertension Father     Social History:  Social History   Socioeconomic History  . Marital status: Single    Spouse name: Not on file  . Number of children: Not on file  . Years of education: Not on file  . 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 Smoking status: Former Smoker    Packs/day: 2.00    Types: Cigarettes  . Smokeless  tobacco: Never Used  Vaping Use  . Vaping Use: Some days  . Substances: Nicotine  Substance and Sexual Activity  . Alcohol use: Yes    Comment: social  . Drug use: No  . Sexual activity: Not Currently  Other Topics Concern  . Not on file  Social History Narrative   Lives: with parents.   Employment: warehouse work.   Social Determinants of Health   Financial Resource Strain: Low Risk   . Difficulty of Paying Living Expenses: Not hard at all  Food Insecurity: No Food Insecurity  . Worried About Charity fundraiser in the Last Year: Never true  . Ran Out of Food  in the Last Year: Never true  Transportation Needs: No Transportation Needs  . Lack of Transportation (Medical): No  . Lack of Transportation (Non-Medical): No  Physical Activity: Insufficiently Active  . Days of Exercise per Week: 3 days  . Minutes of Exercise per Session: 40 min  Stress: Stress Concern Present  . Feeling of Stress : To some extent  Social Connections: Socially Isolated  . Frequency of Communication with Friends and Family: More than three times a week  . Frequency of Social Gatherings with Friends and Family: Twice a week  . Attends Religious Services: Never  . Active Member of Clubs or Organizations: No  . Attends Archivist Meetings: Never  . Marital Status: Never married    Allergies:  Allergies  Allergen Reactions  . Ceclor [Cefaclor] Hives    Metabolic Disorder Labs: No results found for: HGBA1C, MPG No results found for: PROLACTIN No results found for: CHOL, TRIG, HDL, CHOLHDL, VLDL, LDLCALC No results found for: TSH  Therapeutic Level Labs: No results found for: LITHIUM No results found for: VALPROATE No components found for:  CBMZ  Current Medications: Current Outpatient Medications  Medication Sig Dispense Refill  . atomoxetine (STRATTERA) 40 MG capsule Take 1 capsule (40 mg total) by mouth daily. 30 capsule 4  . azithromycin (ZITHROMAX) 250 MG tablet Take 1 tablet (250 mg total) by mouth daily. Take first 2 tablets together, then 1 every day until finished. 6 tablet 0  . hydrOXYzine (ATARAX/VISTARIL) 10 MG tablet Take 1 tablet (10 mg total) by mouth 3 (three) times daily as needed. 90 tablet 4  . lamoTRIgine (LAMICTAL) 100 MG tablet Take 1 tablet (100 mg total) by mouth daily. 30 tablet 4  . meloxicam (MOBIC) 7.5 MG tablet Take 2 tablets (15 mg total) by mouth daily. 30 tablet 0  . mirtazapine (REMERON) 7.5 MG tablet Take 1 tablet (7.5 mg total) by mouth at bedtime. 30 tablet 4  . predniSONE (DELTASONE) 20 MG tablet Take 2 tablets (40  mg total) by mouth daily. 8 tablet 0   No current facility-administered medications for this visit.     Musculoskeletal: Strength & Muscle Tone: within normal limits Gait & Station: normal Patient leans: N/A  Psychiatric Specialty Exam: Review of Systems  There were no vitals taken for this visit.There is no height or weight on file to calculate BMI.  General Appearance: Well Groomed  Eye Contact:  Good  Speech:  Clear and Coherent and Normal Rate  Volume:  Normal  Mood:  Euthymic  Affect:  Congruent  Thought Process:  Coherent, Goal Directed and Linear  Orientation:  Full (Time, Place, and Person)  Thought Content: WDL and Logical   Suicidal Thoughts:  No  Homicidal Thoughts:  No  Memory:  Immediate;   Good Recent;   Good  Remote;   Good  Judgement:  Good  Insight:  Good  Psychomotor Activity:  Normal  Concentration:  Concentration: Good and Attention Span: Good  Recall:  Good  Fund of Knowledge: Good  Language: Good  Akathisia:  No  Handed:  Right  AIMS (if indicated): not done  Assets:  Communication Skills Desire for Improvement Financial Resources/Insurance Housing Social Support  ADL's:  Intact  Cognition: WNL  Sleep:  Good   Screenings: GAD-7   Flowsheet Row Clinical Support from 10/20/2020 in [facility] from 07/27/2020 in [facility] Counselor from 07/12/2020 in [place] from 04/26/2020 in [facility] Counselor from 03/16/2020 in [facility]  Total GAD-7 Score 3 4 4 10 5     PHQ2-9   Flowsheet Row Clinical Support from 10/20/2020 in [facility] from 07/27/2020 in [facility] Counselor from 07/12/2020 in [place] from 04/26/2020 in [facility] Counselor from 03/16/2020 in [facility]  PHQ-2 Total Score 0 1 1 2 2   PHQ-9 Total Score 0 3 4 7 7     Flowsheet Row Clinical Support from 10/20/2020 in [facility] from 07/27/2020 in [facility] Counselor from 07/12/2020 in [place] No Risk No Risk No Risk       Assessment and Plan: Patient reports that he is doing well on his current medication regimen.  No medication changes made today.  Patient agreeable to continue medication as prescribed.  Provider reordered medications with 4 refills.  1. Generalized anxiety disorder  Continue- hydrOXYzine (ATARAX/VISTARIL) 10 MG tablet; Take 1 tablet (10 mg total) by mouth 3 (three) times daily as needed.  Dispense: 90 tablet; Refill: 2  2. Bipolar I disorder (HCC)  Continue- lamoTRIgine (LAMICTAL) 100 MG tablet; Take 1 tablet (100 mg total) by mouth daily.  Dispense: 30 tablet; Refill: 4 Continue- mirtazapine (REMERON) 7.5 MG tablet; Take 1 tablet (7.5 mg total) by mouth at bedtime.  Dispense: 30 tablet; Refill: 4  3. Attention deficit hyperactivity disorder (ADHD), predominantly inattentive type  Start- atomoxetine (STRATTERA) 40 MG capsule; Take 1 capsule (40 mg total) by mouth daily.  Dispense: 30 capsule; Refill: 4   Follow-up in 3 month Follow-up with therapy  Salley Slaughter, NP 10/20/2020, 1:29 PM

## 2020-11-08 ENCOUNTER — Ambulatory Visit (HOSPITAL_COMMUNITY): Payer: No Payment, Other | Admitting: Licensed Clinical Social Worker

## 2020-12-06 ENCOUNTER — Ambulatory Visit (HOSPITAL_COMMUNITY): Payer: No Payment, Other | Admitting: Licensed Clinical Social Worker

## 2022-02-24 IMAGING — CT CT ABD-PELV W/ CM
2 of 4 series · 16 of 46 positions shown, 18 images · IV contrast (omnipaque)
Comparison: CT 09/09/2008

CLINICAL DATA: Right lower quadrant abdominal pain.

EXAM:
CT ABDOMEN AND PELVIS WITH CONTRAST
TECHNIQUE: Multidetector CT imaging of the abdomen and pelvis was performed
using the standard protocol following bolus administration of
intravenous contrast.
CONTRAST:  100mL OMNIPAQUE IOHEXOL 300 MG/ML  SOLN

[Series 2: axial st · axial · 0.84mm/px · z∈[-561,-86]mm · 13 of 109 slices shown, 15 images]
[im 7/109  soft-tissue]
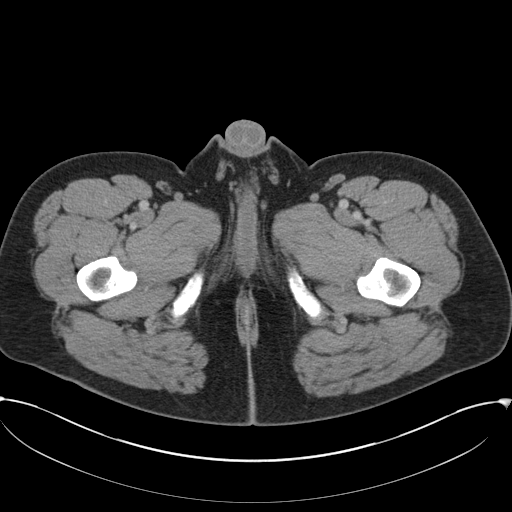
[im 7/109  bone]
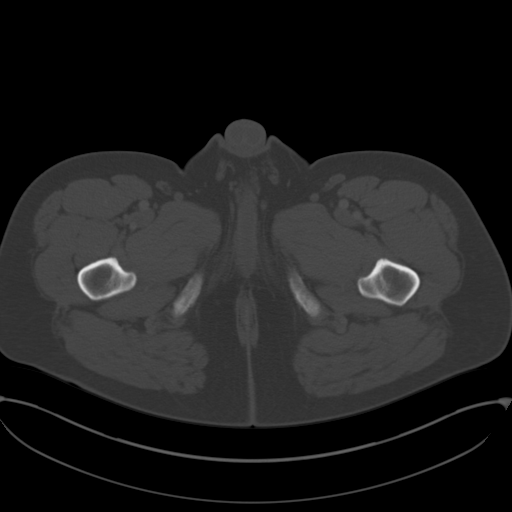
[im 13/109  soft-tissue]
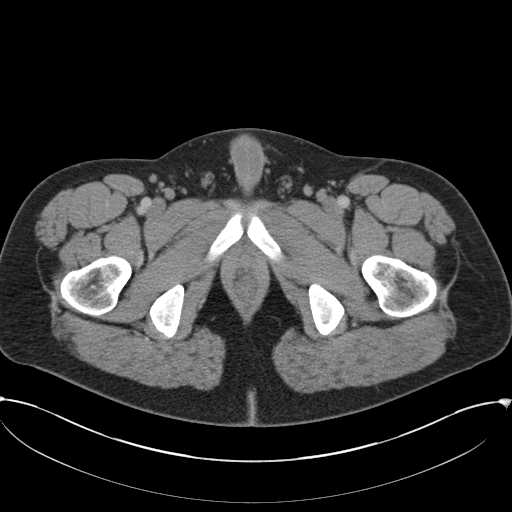
[im 26/109  soft-tissue]
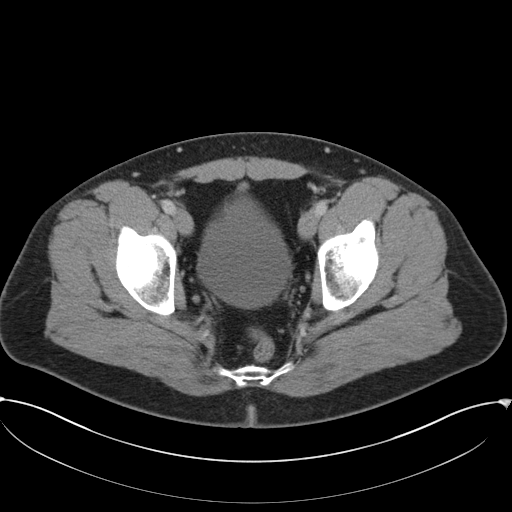
[im 32/109  soft-tissue]
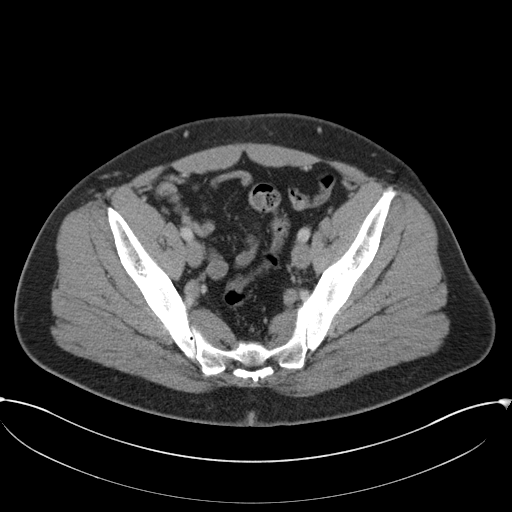
[im 39/109  soft-tissue]
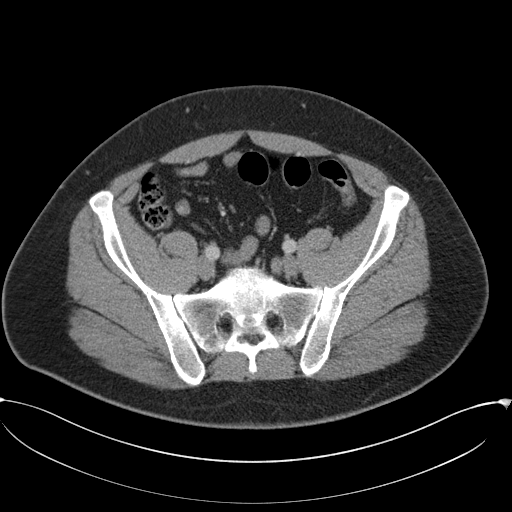
[im 45/109  soft-tissue]
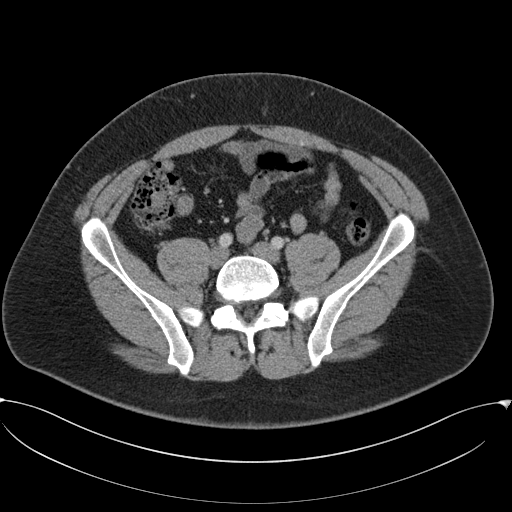
[im 58/109  soft-tissue]
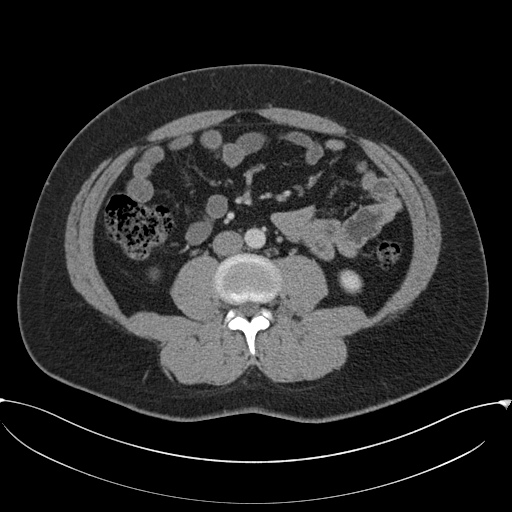
[im 64/109  soft-tissue]
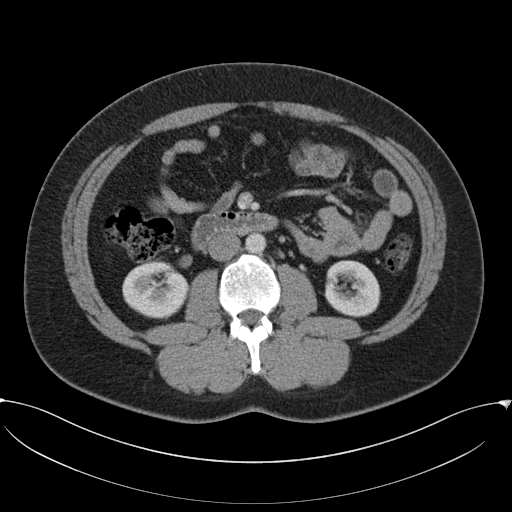
[im 70/109  soft-tissue]
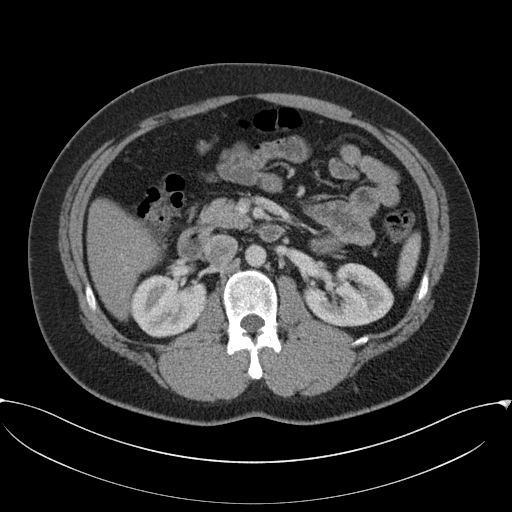
[im 70/109  bone]
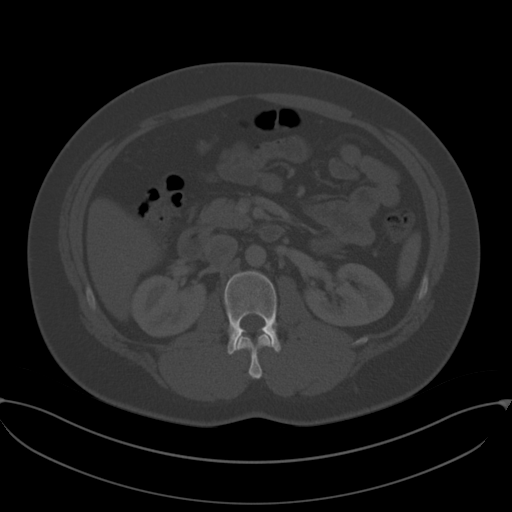
[im 77/109  soft-tissue]
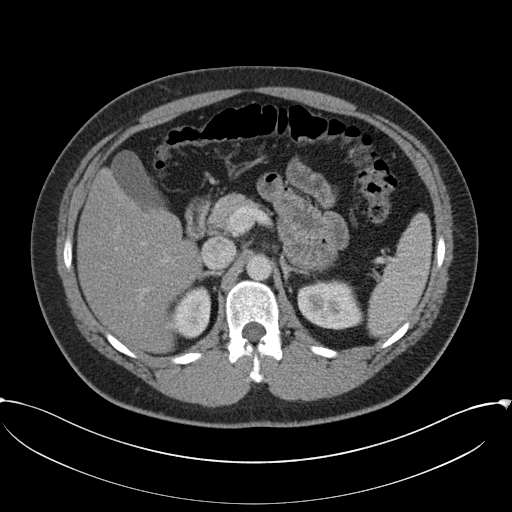
[im 83/109  soft-tissue]
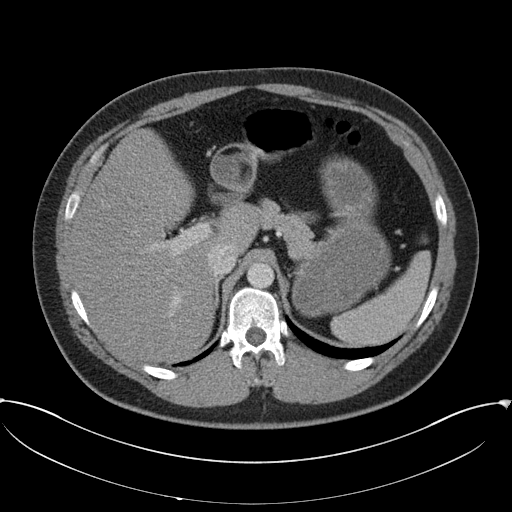
[im 96/109  soft-tissue]
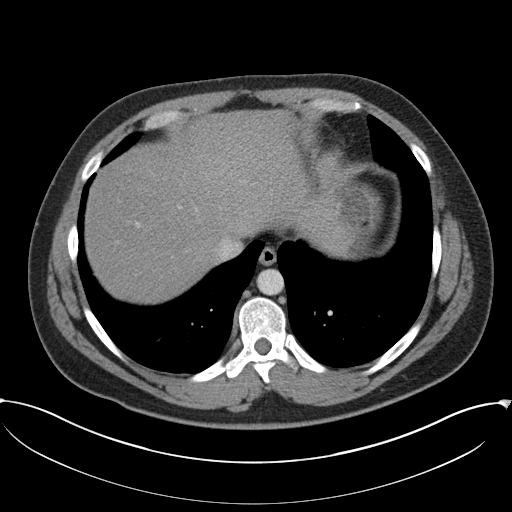
[im 102/109  soft-tissue]
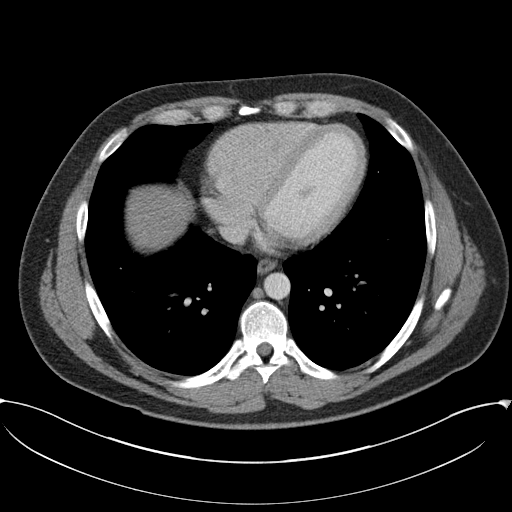

[Series 5: coronal st · coronal · 0.91mm/px · 3 of 163 slices shown]
[im 55/163  soft-tissue]
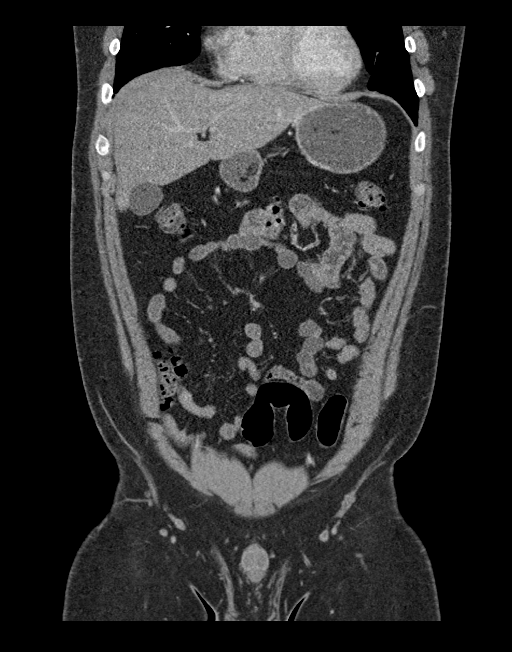
[im 73/163  soft-tissue]
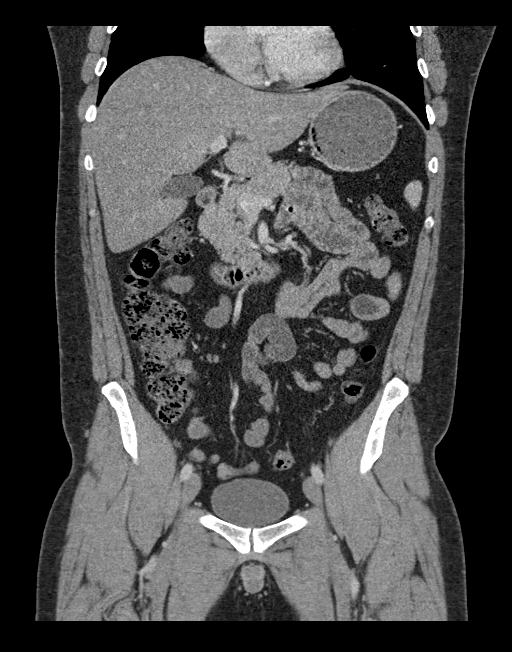
[im 91/163  soft-tissue]
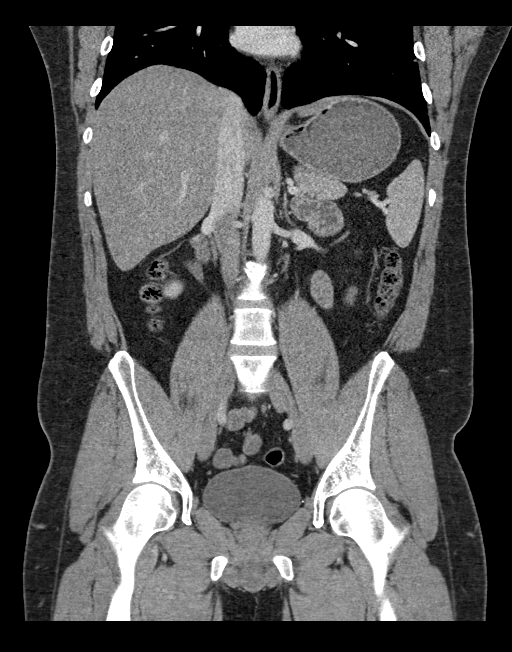

[16 of 46 positions shown; findings below may reference images not displayed]

FINDINGS: Lower chest: Lung bases are clear.

Hepatobiliary: Mild hepatic steatosis without focal lesion.
Gallbladder physiologically distended, no calcified stone. No
biliary dilatation.

Pancreas: Unremarkable. No pancreatic ductal dilatation or
surrounding inflammatory changes.

Spleen: Normal in size without focal abnormality.

Adrenals/Urinary Tract: Normal adrenal glands. No hydronephrosis or
perinephric edema. Homogeneous renal enhancement. No evidence of
renal calculi. Urinary bladder is physiologically distended without
wall thickening.

Stomach/Bowel: Stomach is within normal limits. Appendix appears
normal, for example series 5 image 88. Terminal ileum is normal. No
evidence of bowel wall thickening, distention, or inflammatory
changes.

Vascular/Lymphatic: No significant vascular findings are present. No
enlarged abdominal or pelvic lymph nodes.

Reproductive: Prostate is unremarkable.

Other: No free air, free fluid, or intra-abdominal fluid collection.
No abdominal wall hernia.

Musculoskeletal: There are no acute or suspicious osseous
abnormalities. Limbic vertebra at L3, incidental.
IMPRESSION: 1. No acute abnormality or explanation for abdominal pain. Normal
appendix.
2. Mild hepatic steatosis.

## 2022-06-30 ENCOUNTER — Other Ambulatory Visit: Payer: Self-pay

## 2022-06-30 ENCOUNTER — Emergency Department (HOSPITAL_COMMUNITY)
Admission: EM | Admit: 2022-06-30 | Discharge: 2022-06-30 | Disposition: A | Discharge status: Home / Self Care | Payer: Self-pay | Attending: Emergency Medicine | Admitting: Emergency Medicine

## 2022-06-30 DIAGNOSIS — N341 Nonspecific urethritis: Secondary | ICD-10-CM | POA: Insufficient documentation

## 2022-06-30 DIAGNOSIS — N342 Other urethritis: Secondary | ICD-10-CM

## 2022-06-30 MED ORDER — CEFTRIAXONE SODIUM 1 G IJ SOLR
500.0000 mg | Freq: Once | INTRAMUSCULAR | Status: AC
  Administered 2022-06-30: 500 mg via INTRAMUSCULAR
  Filled 2022-06-30: qty 10

## 2022-06-30 MED ORDER — DOXYCYCLINE HYCLATE 100 MG PO TABS: 100.0000 mg | ORAL_TABLET | Freq: Two times a day (BID) | ORAL | 0 refills | Status: AC

## 2022-06-30 MED ORDER — LIDOCAINE HCL (PF) 1 % IJ SOLN
1.0000 mL | Freq: Once | INTRAMUSCULAR | Status: AC
  Administered 2022-06-30: 1.2 mL
  Filled 2022-06-30: qty 30

## 2022-06-30 NOTE — ED Provider Notes (Signed)
[facility] Provider Note   CSN: [id number] Arrival date & time: 06/30/22  2250     History  Chief Complaint  Patient presents with   Dysuria   Abdominal Pain    Ernest Jennings is a 32 y.o. male.  Patient presents to the emergency department for evaluation of dysuria that started today.  He has had some mild suprapubic and bilateral lower abdominal discomfort over the past several days.  No associated fever, nausea, vomiting, diarrhea, constipation.  He noted a little clear penile discharge today.  He states that he was called tonight and told by a recent sexual partner that they had tested positive for chlamydia.  Patient here for testing and treatment.  Denies any sores or lesions externally.  He states that he has had STI in the past.  PCP       Home Medications Prior to Admission medications   Medication Sig Start Date End Date Taking? Authorizing Provider  doxycycline (VIBRA-TABS) 100 MG tablet Take 1 tablet (100 mg total) by mouth 2 (two) times daily. 06/30/22  Yes Carlisle Cater, PA-C  atomoxetine (STRATTERA) 40 MG capsule Take 1 capsule (40 mg total) by mouth daily. 10/20/20   Salley Slaughter, NP  azithromycin (ZITHROMAX) 250 MG tablet Take 1 tablet (250 mg total) by mouth daily. Take first 2 tablets together, then 1 every day until finished. 09/08/19   Recardo Evangelist, PA-C  hydrOXYzine (ATARAX/VISTARIL) 10 MG tablet Take 1 tablet (10 mg total) by mouth 3 (three) times daily as needed. 10/20/20   Salley Slaughter, NP  lamoTRIgine (LAMICTAL) 100 MG tablet Take 1 tablet (100 mg total) by mouth daily. 10/20/20   Salley Slaughter, NP  meloxicam (MOBIC) 7.5 MG tablet Take 2 tablets (15 mg total) by mouth daily. 02/27/20   Larene Pickett, PA-C  mirtazapine (REMERON) 7.5 MG tablet Take 1 tablet (7.5 mg total) by mouth at bedtime. 10/20/20   Salley Slaughter, NP  predniSONE (DELTASONE) 20 MG tablet Take 2 tablets (40 mg total)  by mouth daily. 09/08/19   Recardo Evangelist, PA-C      Allergies    Ceclor [cefaclor] and Shellfish allergy    Review of Systems   Review of Systems  Physical Exam Updated Vital Signs BP (!) 130/99   Pulse 94   Temp 98.1 F (36.7 C) (Oral)   Resp 15   Ht 6\' 1"  (1.854 m)   Wt 103.4 kg   SpO2 100%   BMI 30.08 kg/m  Physical Exam Vitals and nursing note reviewed.  Constitutional:      Appearance: He is well-developed.  HENT:     Head: Normocephalic and atraumatic.  Eyes:     Conjunctiva/sclera: Conjunctivae normal.  Pulmonary:     Effort: No respiratory distress.  Abdominal:     Tenderness: There is no abdominal tenderness. There is no guarding or rebound. Negative signs include Murphy's sign and McBurney's sign.  Genitourinary:    Comments: Patient defered Musculoskeletal:     Cervical back: Normal range of motion and neck supple.  Skin:    General: Skin is warm and dry.  Neurological:     Mental Status: He is alert.     ED Results / Procedures / Treatments   Labs (all labs ordered are listed, but only abnormal results are displayed) Labs Reviewed  GC/CHLAMYDIA PROBE AMP ([place]) NOT AT [facility]    EKG None  Radiology No results  found.  Procedures Procedures    Medications Ordered in ED Medications  cefTRIAXone (ROCEPHIN) injection 500 mg (has no administration in time range)  lidocaine (PF) (XYLOCAINE) 1 % injection 1-2.1 mL (has no administration in time range)    ED Course/ Medical Decision Making/ A&P    Patient seen and examined. History obtained directly from patient.   Labs/EKG: GC chlamydia testing pending.  Offered RPR and HIV, patient declined citing fear of needles.  Imaging: None ordered  Medications/Fluids: IM Rocephin x 1  Most recent vital signs reviewed and are as follows: BP (!) 130/99   Pulse 94   Temp 98.1 F (36.7 C) (Oral)   Resp 15   Ht 6\' 1"  (1.854 m)   Wt 103.4 kg   SpO2 100%   BMI 30.08 kg/m   Initial  impression: Urethritis  Home treatment plan: Will test and treat for STD exposure. Patient offered HIV and syphilis testing. Patient counseled on safe sexual practices, encouraged them to avoid sexual contact for 7 days and to inform sexual partners so that they can get tested and treated as well. Patient verbalizes understanding and agrees with plan.                              Medical Decision Making Risk Prescription drug management.   Patient with some lower abdominal discomfort, now with suprapubic pain, dysuria and penile discharge.  Recent history suggest urethritis.  Do not suspect significant intra-abdominal etiology such as appendicitis, colitis, enteritis, kidney stone.  No tenderness on exam.  Patient looks well but anxious.  Treatment plan as above.  The patient's vital signs, pertinent lab work and imaging were reviewed and interpreted as discussed in the ED course. Hospitalization was considered for further testing, treatments, or serial exams/observation. However as patient is well-appearing, has a stable exam, and reassuring studies today, I do not feel that they warrant admission at this time. This plan was discussed with the patient who verbalizes agreement and comfort with this plan and seems reliable and able to return to the Emergency Department with worsening or changing symptoms.           Final Clinical Impression(s) / ED Diagnoses Final diagnoses:  Urethritis    Rx / DC Orders ED Discharge Orders          Ordered    doxycycline (VIBRA-TABS) 100 MG tablet  2 times daily        06/30/22 2312              Carlisle Cater, PA-C 06/30/22 2317    Fatima Blank, MD 07/01/22 [phone]

## 2022-06-30 NOTE — ED Triage Notes (Signed)
Pt reports lower abdominal pain and dysuria that began today. Pt reports he had a recent exposure to chlamydia.

## 2022-06-30 NOTE — Discharge Instructions (Signed)
Please read and follow all provided instructions.  Your diagnoses today include:  1. Urethritis     Tests performed today include: Test for gonorrhea and chlamydia.  Vital signs. See below for your results today.   Medications:  For treatment of gonorrhea: You were treated with a rocephin (shot) today.   For treatment of chlamydia: Doxycycline - antibiotic  You have been prescribed an antibiotic medicine: take the entire course of medicine even if you are feeling better. Stopping early can cause the antibiotic not to work.  Home care instructions:  Read educational materials contained in this packet and follow any instructions provided.   You should tell your partners about your infection and avoid having sex for one week to allow time for the medicine to work.  Sexually transmitted disease testing also available at:  [place] [place] [facility] [address] [place], phone [phone] or [phone]   Monday - Friday, call for an appointment  Return instructions:  Please return to the Emergency Department if you experience worsening symptoms.  Please return if you have any other emergent concerns.  Additional Information:  Your vital signs today were: BP (!) 130/99   Pulse 94   Temp 98.1 F (36.7 C) (Oral)   Resp 15   Ht 6\' 1"  (1.854 m)   Wt 103.4 kg   SpO2 100%   BMI 30.08 kg/m  If your blood pressure (BP) was elevated above 135/85 this visit, please have this repeated by your doctor within one month. --------------

## 2022-07-02 LAB — GC/CHLAMYDIA PROBE AMP (~~LOC~~) NOT AT ARMC
Chlamydia: NEGATIVE
Comment: NEGATIVE
Comment: NORMAL
Neisseria Gonorrhea: NEGATIVE
